# Patient Record
Sex: Female | Born: 1960 | Race: White | Hispanic: No | Marital: Married | Smoking: Former smoker
Health system: Southern US, Community
[De-identification: ages and names within clinical notes are randomized; demographics above are authoritative.]

## PROBLEM LIST (undated history)

## (undated) DIAGNOSIS — M5030 Other cervical disc degeneration, unspecified cervical region: Secondary | ICD-10-CM

## (undated) DIAGNOSIS — G8929 Other chronic pain: Secondary | ICD-10-CM

## (undated) DIAGNOSIS — M25569 Pain in unspecified knee: Secondary | ICD-10-CM

## (undated) DIAGNOSIS — T7840XA Allergy, unspecified, initial encounter: Secondary | ICD-10-CM

## (undated) DIAGNOSIS — R7303 Prediabetes: Secondary | ICD-10-CM

## (undated) DIAGNOSIS — Z720 Tobacco use: Secondary | ICD-10-CM

## (undated) DIAGNOSIS — F419 Anxiety disorder, unspecified: Secondary | ICD-10-CM

## (undated) DIAGNOSIS — F32A Depression, unspecified: Secondary | ICD-10-CM

## (undated) DIAGNOSIS — R269 Unspecified abnormalities of gait and mobility: Secondary | ICD-10-CM

## (undated) DIAGNOSIS — M25519 Pain in unspecified shoulder: Secondary | ICD-10-CM

## (undated) DIAGNOSIS — M6740 Ganglion, unspecified site: Secondary | ICD-10-CM

## (undated) DIAGNOSIS — E079 Disorder of thyroid, unspecified: Secondary | ICD-10-CM

## (undated) DIAGNOSIS — I10 Essential (primary) hypertension: Secondary | ICD-10-CM

## (undated) DIAGNOSIS — K219 Gastro-esophageal reflux disease without esophagitis: Secondary | ICD-10-CM

## (undated) DIAGNOSIS — E039 Hypothyroidism, unspecified: Secondary | ICD-10-CM

## (undated) DIAGNOSIS — M1990 Unspecified osteoarthritis, unspecified site: Secondary | ICD-10-CM

## (undated) DIAGNOSIS — R000 Tachycardia, unspecified: Secondary | ICD-10-CM

## (undated) DIAGNOSIS — F329 Major depressive disorder, single episode, unspecified: Secondary | ICD-10-CM

## (undated) HISTORY — DX: Pain in unspecified shoulder: M25.519

## (undated) HISTORY — DX: Pain in unspecified knee: M25.569

## (undated) HISTORY — DX: Disorder of thyroid, unspecified: E07.9

## (undated) HISTORY — DX: Allergy, unspecified, initial encounter: T78.40XA

## (undated) HISTORY — PX: LUNG SURGERY: SHX703

## (undated) HISTORY — PX: FOOT SURGERY: SHX648

## (undated) HISTORY — DX: Anxiety disorder, unspecified: F41.9

## (undated) HISTORY — DX: Ganglion, unspecified site: M67.40

## (undated) HISTORY — DX: Tobacco use: Z72.0

## (undated) HISTORY — PX: FACIAL FRACTURE SURGERY: SHX1570

## (undated) HISTORY — DX: Other chronic pain: G89.29

## (undated) HISTORY — DX: Major depressive disorder, single episode, unspecified: F32.9

## (undated) HISTORY — DX: Depression, unspecified: F32.A

---

## 1995-05-08 HISTORY — PX: AUGMENTATION MAMMAPLASTY: SUR837

## 1997-07-09 ENCOUNTER — Inpatient Hospital Stay (HOSPITAL_COMMUNITY): Admission: AD | Admit: 1997-07-09 | Discharge: 1997-07-14 | Payer: Self-pay | Admitting: *Deleted

## 1997-08-17 ENCOUNTER — Other Ambulatory Visit: Admission: RE | Admit: 1997-08-17 | Discharge: 1997-08-17 | Payer: Self-pay | Admitting: *Deleted

## 1998-09-13 ENCOUNTER — Other Ambulatory Visit: Admission: RE | Admit: 1998-09-13 | Discharge: 1998-09-13 | Payer: Self-pay | Admitting: *Deleted

## 1998-12-30 ENCOUNTER — Ambulatory Visit (HOSPITAL_COMMUNITY): Admission: RE | Admit: 1998-12-30 | Discharge: 1998-12-30 | Payer: Self-pay | Admitting: Obstetrics and Gynecology

## 1998-12-30 ENCOUNTER — Encounter: Payer: Self-pay | Admitting: Obstetrics and Gynecology

## 1999-03-22 ENCOUNTER — Inpatient Hospital Stay (HOSPITAL_COMMUNITY): Admission: AD | Admit: 1999-03-22 | Discharge: 1999-03-25 | Payer: Self-pay | Admitting: *Deleted

## 1999-05-11 ENCOUNTER — Other Ambulatory Visit: Admission: RE | Admit: 1999-05-11 | Discharge: 1999-05-11 | Payer: Self-pay | Admitting: *Deleted

## 2000-09-02 ENCOUNTER — Other Ambulatory Visit: Admission: RE | Admit: 2000-09-02 | Discharge: 2000-09-02 | Payer: Self-pay | Admitting: *Deleted

## 2001-09-04 ENCOUNTER — Other Ambulatory Visit: Admission: RE | Admit: 2001-09-04 | Discharge: 2001-09-04 | Payer: Self-pay | Admitting: *Deleted

## 2002-05-07 HISTORY — PX: COLONOSCOPY: SHX174

## 2002-09-18 ENCOUNTER — Other Ambulatory Visit: Admission: RE | Admit: 2002-09-18 | Discharge: 2002-09-18 | Payer: Self-pay | Admitting: *Deleted

## 2003-10-28 ENCOUNTER — Other Ambulatory Visit: Admission: RE | Admit: 2003-10-28 | Discharge: 2003-10-28 | Payer: Self-pay | Admitting: Obstetrics and Gynecology

## 2006-10-29 ENCOUNTER — Encounter (INDEPENDENT_AMBULATORY_CARE_PROVIDER_SITE_OTHER): Payer: Self-pay | Admitting: Internal Medicine

## 2006-12-03 ENCOUNTER — Ambulatory Visit: Payer: Self-pay | Admitting: Family Medicine

## 2006-12-03 ENCOUNTER — Encounter (INDEPENDENT_AMBULATORY_CARE_PROVIDER_SITE_OTHER): Payer: Self-pay | Admitting: Internal Medicine

## 2006-12-03 DIAGNOSIS — F411 Generalized anxiety disorder: Secondary | ICD-10-CM | POA: Insufficient documentation

## 2006-12-03 DIAGNOSIS — F329 Major depressive disorder, single episode, unspecified: Secondary | ICD-10-CM | POA: Insufficient documentation

## 2006-12-03 DIAGNOSIS — L659 Nonscarring hair loss, unspecified: Secondary | ICD-10-CM | POA: Insufficient documentation

## 2006-12-03 DIAGNOSIS — K219 Gastro-esophageal reflux disease without esophagitis: Secondary | ICD-10-CM | POA: Insufficient documentation

## 2006-12-04 ENCOUNTER — Telehealth (INDEPENDENT_AMBULATORY_CARE_PROVIDER_SITE_OTHER): Payer: Self-pay | Admitting: Internal Medicine

## 2006-12-05 LAB — CONVERTED CEMR LAB
BUN: 19 mg/dL (ref 6–23)
Basophils Absolute: 0 10*3/uL (ref 0.0–0.1)
Basophils Relative: 0 % (ref 0–1)
CO2: 25 meq/L (ref 19–32)
Calcium: 9.7 mg/dL (ref 8.4–10.5)
Chloride: 101 meq/L (ref 96–112)
Creatinine, Ser: 0.87 mg/dL (ref 0.40–1.20)
Eosinophils Absolute: 0.2 10*3/uL (ref 0.0–0.7)
Eosinophils Relative: 1 % (ref 0–5)
Glucose, Bld: 69 mg/dL — ABNORMAL LOW (ref 70–99)
HCT: 43.4 % (ref 36.0–46.0)
Hemoglobin: 14.5 g/dL (ref 12.0–15.0)
Lymphocytes Relative: 47 % — ABNORMAL HIGH (ref 12–46)
Lymphs Abs: 6 10*3/uL — ABNORMAL HIGH (ref 0.7–3.3)
MCHC: 33.4 g/dL (ref 30.0–36.0)
MCV: 98.6 fL (ref 78.0–100.0)
Monocytes Absolute: 0.8 10*3/uL — ABNORMAL HIGH (ref 0.2–0.7)
Monocytes Relative: 6 % (ref 3–11)
Neutro Abs: 5.9 10*3/uL (ref 1.7–7.7)
Neutrophils Relative %: 46 % (ref 43–77)
Platelets: 360 10*3/uL (ref 150–400)
Potassium: 4.7 meq/L (ref 3.5–5.3)
RBC: 4.4 M/uL (ref 3.87–5.11)
RDW: 13.4 % (ref 11.5–14.0)
Sodium: 141 meq/L (ref 135–145)
TSH: 2.767 microintl units/mL (ref 0.350–5.50)
WBC: 12.8 10*3/uL — ABNORMAL HIGH (ref 4.0–10.5)

## 2007-12-04 ENCOUNTER — Ambulatory Visit: Payer: Self-pay | Admitting: Obstetrics and Gynecology

## 2010-02-15 ENCOUNTER — Ambulatory Visit: Payer: Self-pay

## 2010-09-22 NOTE — Op Note (Signed)
[facility]  Patient:    Eileen Novak                  MRN: [id number] Proc. Date: 03/22/99 Adm. Date:  [id number] Attending:  Donne Hazel                           Operative Report  PREOPERATIVE DIAGNOSIS:       Intrauterine pregnancy at term.  Repeat cesarean section.  POSTOPERATIVE DIAGNOSIS:      Intrauterine pregnancy at term.  Repeat cesarean section.  OPERATION:                    Repeat low transverse cesarean section.  SURGEON:                      Willey Blade, M.D.  ASSISTANT:  ANESTHESIA:                   Spinal.  ESTIMATED BLOOD LOSS:         800 cc.  COMPLICATIONS:                None.  FINDINGS:                     At 1240 through a low transverse uterine incision, a viable female infant was delivered promptly from the vertex presentation without difficulty.  Apgars were 9 and 9.  Weight is pending.  The pelvis was visualized at time of surgery and noted to be normal.  DESCRIPTION OF PROCEDURE:     The patient was taken to the operating room where a spinal anesthetic was administered.  The patient was placed on the operating table in the left lateral tilt position.  The abdomen was prepped and draped in the usual sterile fashion with Betadine and sterile drapes.  A Foley catheter was sterilely inserted.  The abdomen was then entered through a Pfannenstiel incision and carried down sharply in the usual fashion.  The peritoneum was atraumatically.  The vesicouterine peritoneum overlying the lower uterine segment was incised and a bladder flap was bluntly and sharply created over the lower uterine segment.  A  bladder blade was then placed behind the bladder.  The uterus was then entered through a low transverse incision and carried out laterally using the operators  fingers.  The membranes were entered with clear fluid noted.  The vertex was easily elevated into the incision and delivered promptly at  1240.  The oral and nasopharynx were thoroughly bulb suctioned.  A nuchal cord x 1 was reduced and he baby delivered promptly.  The cord was doubly clamped and cut and the baby handed promptly to the pediatricians, Apgars were 9 and 9.  Delivery went well.  The placenta was then manually extracted intact with three vessel cord without difficulty.  The interior of the uterus was wiped clean thoroughly with a wet sponge.  The uterine incision was then closed in a two layer fashion, the first  layer a running interlocking suture of #1 Vicryl.  A second imbricating suture as placed across the primary suture line with a running stitch of #1 Vicryl as well. Good hemostasis was noted.  The pelvis was thoroughly irrigated and noted to be  hemostatic.  The pelvis was then visualized and noted to be normal.  Attention was then turned to closure.  Good hemostasis was once  again noted. The rectus muscle and anterior peritoneum was closed in the midline with a running stitch of #1 Vicryl.  The muscles were plicated in the midline with two interrupted sutures of #1 Vicryl.  The subfascial areas were hemostatic.  The fascia was then closed with #1 Vicryl, this was done by placing two sutures at the periphery of the incision and with two running stitches meeting in the midline.  The subcutaneous tissue was irrigated and made hemostatic using Bovie cautery.  The skin reapproximated with staples and a sterile dressing applied.  Final sponge, needle, and instrument counts were correct x 3.  There were no perioperative complications. The patient did well.   She did receive Cefotan 1 gram prior to delivery. DD:  03/22/99 TD:  03/22/99 Job: [id number]

## 2011-04-09 ENCOUNTER — Ambulatory Visit: Payer: Self-pay | Admitting: General Practice

## 2011-05-03 ENCOUNTER — Ambulatory Visit: Payer: Self-pay

## 2014-02-10 ENCOUNTER — Ambulatory Visit: Payer: Self-pay | Admitting: Family Medicine

## 2014-11-25 ENCOUNTER — Other Ambulatory Visit: Payer: Self-pay

## 2014-11-25 DIAGNOSIS — K219 Gastro-esophageal reflux disease without esophagitis: Secondary | ICD-10-CM

## 2014-11-25 NOTE — Telephone Encounter (Signed)
Refill request was sent to Dr. Ashany Sundaram for approval and submission.  

## 2014-11-29 ENCOUNTER — Other Ambulatory Visit: Payer: Self-pay | Admitting: Family Medicine

## 2014-11-29 NOTE — Telephone Encounter (Signed)
PT NEEDS ZANTAC (NEES THE GENERIC ) . PHARM IS [facility]

## 2014-11-29 NOTE — Telephone Encounter (Signed)
Refill request was sent to Dr. Edwena Felty for approval and submission.  Ranitidine HCl , 1 (one) Capsule daily, #30, 30 days starting 07/29/2014, Ref. x2. Active. Recorded 07/29/2014 02:14 PM by Edwena Felty, MD, Annotation/Addendum. ePrescribed to [phone], 07/29/2014 2:14 PM [facility], [address] [place], [place], [place] [address] [phone]

## 2014-12-03 ENCOUNTER — Other Ambulatory Visit: Payer: Self-pay

## 2014-12-03 DIAGNOSIS — K219 Gastro-esophageal reflux disease without esophagitis: Secondary | ICD-10-CM

## 2014-12-03 NOTE — Telephone Encounter (Signed)
Refill request was sent to Dr. Ashany Sundaram for approval and submission.  Ranitidine HCl 300MG, 1 (one) Capsule daily, #30, 30 days starting 07/29/2014, Ref. x2. Active. Recorded 07/29/2014 02:14 PM by ASHANY SUNDARAM, MD, Annotation/Addendum. ePrescribed to [phone], 07/29/2014 2:14 PM [facility], [address], [place], [place] [address] [phone] 

## 2014-12-13 ENCOUNTER — Other Ambulatory Visit: Payer: Self-pay | Admitting: Family Medicine

## 2014-12-13 NOTE — Telephone Encounter (Signed)
Pt needs a refill on alprazolam and is requesting a refill. [facility]. Pt has not been seen since 12/25/13 and I explained to pt that she needs to schedule an appt but she was wanting me to ask first.

## 2014-12-13 NOTE — Telephone Encounter (Signed)
Refill request was sent to Dr. Edwena Felty for approval and submission.  ALPRAZolam 0.5MG , 1 (one) Tablet Tablet two times daily, as needed, #60, 30 days starting 11/19/2013, Ref. x2. Active. Associated Diagnosis:Anxiety and depression (300.4  F41.8)  Recorded 12/25/2013 02:03 PM by Phineas Semen, Office Visit.

## 2015-08-29 ENCOUNTER — Encounter: Payer: Self-pay | Admitting: Family Medicine

## 2015-08-29 ENCOUNTER — Ambulatory Visit: Payer: Self-pay | Admitting: Family Medicine

## 2015-08-29 ENCOUNTER — Ambulatory Visit (INDEPENDENT_AMBULATORY_CARE_PROVIDER_SITE_OTHER): Payer: Medicaid Other | Admitting: Family Medicine

## 2015-08-29 VITALS — BP 124/68 | HR 85 | Temp 98.5°F | Resp 16 | Ht 65.0 in | Wt 136.0 lb

## 2015-08-29 DIAGNOSIS — F419 Anxiety disorder, unspecified: Secondary | ICD-10-CM

## 2015-08-29 DIAGNOSIS — M79671 Pain in right foot: Secondary | ICD-10-CM | POA: Insufficient documentation

## 2015-08-29 DIAGNOSIS — M25512 Pain in left shoulder: Secondary | ICD-10-CM

## 2015-08-29 DIAGNOSIS — K5900 Constipation, unspecified: Secondary | ICD-10-CM | POA: Diagnosis not present

## 2015-08-29 DIAGNOSIS — G8929 Other chronic pain: Secondary | ICD-10-CM | POA: Diagnosis not present

## 2015-08-29 DIAGNOSIS — M542 Cervicalgia: Secondary | ICD-10-CM | POA: Diagnosis not present

## 2015-08-29 DIAGNOSIS — M546 Pain in thoracic spine: Secondary | ICD-10-CM | POA: Insufficient documentation

## 2015-08-29 MED ORDER — POLYETHYLENE GLYCOL 3350 17 GM/SCOOP PO POWD: 8.5000 g | Freq: Every day | ORAL | Status: DC | PRN

## 2015-08-29 NOTE — Patient Instructions (Signed)
Try turmeric as a natural anti-inflammatory (for pain and arthritis). It comes in capsules where you buy aspirin and fish oil, but also as a spice where you buy pepper and garlic powder. Try aleve 220 mg one by mouth twice a day with food If needed, tylenol can be taken per package directions We'll refer you to the foot doctor and the physical therapist and the psychiatrist
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
## 2015-09-06 ENCOUNTER — Emergency Department: Payer: Medicaid Other

## 2015-09-06 ENCOUNTER — Encounter: Payer: Self-pay | Admitting: Emergency Medicine

## 2015-09-06 ENCOUNTER — Emergency Department
Admission: EM | Admit: 2015-09-06 | Discharge: 2015-09-06 | Disposition: A | Discharge status: Home / Self Care | Payer: Medicaid Other | Attending: Emergency Medicine | Admitting: Emergency Medicine

## 2015-09-06 DIAGNOSIS — S40022A Contusion of left upper arm, initial encounter: Secondary | ICD-10-CM | POA: Diagnosis not present

## 2015-09-06 DIAGNOSIS — F17200 Nicotine dependence, unspecified, uncomplicated: Secondary | ICD-10-CM | POA: Insufficient documentation

## 2015-09-06 DIAGNOSIS — F329 Major depressive disorder, single episode, unspecified: Secondary | ICD-10-CM | POA: Insufficient documentation

## 2015-09-06 DIAGNOSIS — Y9389 Activity, other specified: Secondary | ICD-10-CM | POA: Diagnosis not present

## 2015-09-06 DIAGNOSIS — Y9289 Other specified places as the place of occurrence of the external cause: Secondary | ICD-10-CM | POA: Insufficient documentation

## 2015-09-06 DIAGNOSIS — K5900 Constipation, unspecified: Secondary | ICD-10-CM | POA: Insufficient documentation

## 2015-09-06 DIAGNOSIS — Y999 Unspecified external cause status: Secondary | ICD-10-CM | POA: Diagnosis not present

## 2015-09-06 DIAGNOSIS — M79602 Pain in left arm: Secondary | ICD-10-CM | POA: Diagnosis present

## 2015-09-06 DIAGNOSIS — W1800XA Striking against unspecified object with subsequent fall, initial encounter: Secondary | ICD-10-CM | POA: Insufficient documentation

## 2015-09-06 DIAGNOSIS — S63502A Unspecified sprain of left wrist, initial encounter: Secondary | ICD-10-CM | POA: Insufficient documentation

## 2015-09-06 MED ORDER — NAPROXEN 500 MG PO TABS: 500.0000 mg | ORAL_TABLET | Freq: Two times a day (BID) | ORAL | Status: DC

## 2015-09-06 NOTE — Assessment & Plan Note (Signed)
I encouraged patient to work with counselor; I believe she will benefit from EBT; I also recommended that she see a psychaitrist if she feels her anxiety is bad enough that it warrants a benzo; I do not plan on refilling the benzo; she is not interested in SSRI or other daily medical therapy;

## 2015-09-06 NOTE — ED Provider Notes (Signed)
[facility] Emergency Department Provider Note  ____________________________________________  Time seen: Approximately 12:12 PM  I have reviewed the triage vital signs and the nursing notes.   HISTORY  Chief Complaint Arm Pain    HPI Eileen Novak is a 55 y.o. female, NAD, presents to the emergency department today after a fall onto her left arm. She states that she was pulling a stump up from her yard when part of it broke off and she fell backwards on to cement, landing on her left wrist and forearm. She complains of pain to the left wrist and distal forearm with movement. She believes she may have hit her head during the fall but denies any LOC, dizziness, headaches, visual changes, numbness, weakness, tingling. Denies any neck or back pain. She has not taken anything nor treated her arm pain at this time. Denies any open wounds or lacerations. Has not noted any swelling or bruising.   Past Medical History  Diagnosis Date  . Anxiety   . Shoulder pain   . Depression   . Allergy     Patient Active Problem List   Diagnosis Date Noted  . Constipation 09/06/2015  . Anxiety disorder 08/29/2015  . Pain of right foot 08/29/2015  . Chronic left shoulder pain 08/29/2015  . Neck pain, chronic 08/29/2015  . Bilateral thoracic back pain 08/29/2015  . DEPRESSION 12/03/2006  . GERD 12/03/2006    Past Surgical History  Procedure Laterality Date  . Foot surgery    . Facial fracture surgery      Current Outpatient Rx  Name  Route  Sig  Dispense  Refill  . naproxen (NAPROSYN) 500 MG tablet   Oral   Take 1 tablet (500 mg total) by mouth 2 (two) times daily with a meal.   14 tablet   0   . polyethylene glycol powder (GLYCOLAX/MIRALAX) powder   Oral   Take 8.5-17 g by mouth daily as needed.   3350 g   1     Allergies Escitalopram oxalate and Penicillins  Family History  Problem Relation Age of Onset  . Cancer Mother     breast  . Depression  Mother   . Heart disease Father   . Depression Brother   . ADD / ADHD Brother     Social History Social History  Substance Use Topics  . Smoking status: Current Every Day Smoker  . Smokeless tobacco: Never Used  . Alcohol Use: 0.0 oz/week    0 Standard drinks or equivalent per week     Review of Systems  Constitutional: No fever/chills Cardiovascular: No chest pain. Respiratory: No cough. No shortness of breath. No wheezing.  Musculoskeletal: Positive for left arm pain. Negative for back pain.  Skin: Negative for rash.  Neurological: Negative for headaches, focal weakness or numbness. 10-point ROS otherwise negative.  ____________________________________________   PHYSICAL EXAM:  VITAL SIGNS: ED Triage Vitals  Enc Vitals Group     BP 09/06/15 1137 110/67 mmHg     Pulse Rate 09/06/15 1137 84     Resp 09/06/15 1137 20     Temp 09/06/15 1137 98.1 F (36.7 C)     Temp Source 09/06/15 1137 Oral     SpO2 09/06/15 1137 97 %     Weight 09/06/15 1137 134 lb (60.782 kg)     Height 09/06/15 1137 5\' 7"  (1.702 m)     Head Cir --      Peak Flow --  Pain Score 09/06/15 1150 7     Pain Loc --      Pain Edu? --      Excl. in GC? --     Constitutional: Alert and oriented. Well appearing and in no acute distress. Eyes: Conjunctivae are normal.  Head: Atraumatic. Cardiovascular: Good peripheral circulationWith 2+ pulses noted in left upper extremity. Capillary refill less than 3 seconds in all digits of the left upper extremity. Respiratory: Normal respiratory effort without tachypnea or retractions. Musculoskeletal: Full ROM of the left upper extremity to include shoulder, elbow, forearm, wrist, hand, fingers. Patient is able to pronate and supinate the left forearm without pain. Trace edema noted to the left wrist. Mild tenderness to palpation diffusely about the left wrist. No joint effusions. Neurologic:  Normal speech and language. No gross focal neurologic deficits are  appreciated.  Skin:  Skin is warm, dry and intact. No rash, bruising or abrasions noted. Psychiatric: Mood and affect are normal. Speech and behavior are normal. Patient exhibits appropriate insight and judgement.    ____________________________________________   LABS  None       ____________________________________________  EKG  None  ____________________________________________ RADIOLOGY I have personally viewed and evaluated these images (plain radiographs) as part of my medical decision making, as well as reviewing the written report by the radiologist.  Dg Hand Complete Left  09/06/2015  CLINICAL DATA:  Pt reports falling outside as she was pulling a limb out of ground and fell backward, landing on left arm. Pt states, "I think I hit my head," denies LOC. Pt with complaints of pain in left arm from wrist to elbow. Pt with full ROM to left arm. EXAM: LEFT HAND - COMPLETE 3+ VIEW COMPARISON:  None. FINDINGS: There is no evidence of fracture or dislocation. There is no evidence of arthropathy or other focal bone abnormality. Soft tissues are unremarkable. IMPRESSION: Negative. Electronically Signed   By: Amie Portland M.D.   On: 09/06/2015 12:43     INITIAL IMPRESSION / ASSESSMENT AND PLAN / ED COURSE  Pertinent imaging results that were available during my care of the patient were reviewed by me and considered in my medical decision making (see chart for details).  Patient's diagnosis is consistent with left wrist sprain and left arm contusion. Patient was placed in a prefabricated left wrist splint for comfort care. Patient will be discharged home with prescriptions for naproxen to take as directed. Patient is advised to apply ice to the affected areas 20 minutes 3-4 times daily as needed. Discussed the patient should complete light range of motion exercises as modeled in clinic today at least 3-4 times daily to decrease pain and swelling. Patient is to follow up with her  primary care provider or [facility] if symptoms persist past this treatment course. Patient is given ED precautions to return to the ED for any worsening or new symptoms.    ____________________________________________  FINAL CLINICAL IMPRESSION(S) / ED DIAGNOSES  Final diagnoses:  Left wrist sprain, initial encounter  Arm contusion, left, initial encounter      NEW MEDICATIONS STARTED DURING THIS VISIT:  New Prescriptions   NAPROXEN (NAPROSYN) 500 MG TABLET    Take 1 tablet (500 mg total) by mouth 2 (two) times daily with a meal.         Hope Pigeon, PA-C 09/06/15 1303  Rockne Menghini, MD 09/06/15 1531

## 2015-09-06 NOTE — Assessment & Plan Note (Signed)
Start miralax; refer to GI for colonoscopy

## 2015-09-06 NOTE — Assessment & Plan Note (Addendum)
Demonstrated stretching exercise; refer to physical therapy; may use turmeric, tylenol per package directions, NSAID; see AVS

## 2015-09-06 NOTE — ED Notes (Signed)
Pt to ed with c/o left arm pain,  Pt states she was pulling on something today and fell backwards landing on left arm.

## 2015-09-06 NOTE — Discharge Instructions (Signed)
Elastic Bandage and RICE WHAT DOES AN ELASTIC BANDAGE DO? Elastic bandages come in different shapes and sizes. They generally provide support to your injury and reduce swelling while you are healing, but they can perform different functions. Your health care provider will help you to decide what is best for your protection, recovery, or rehabilitation following an injury. WHAT ARE SOME GENERAL TIPS FOR USING AN ELASTIC BANDAGE?  Use the bandage as directed by the maker of the bandage that you are using.  Do not wrap the bandage too tightly. This may cut off the circulation in the arm or leg in the area below the bandage.  If part of your body beyond the bandage becomes blue, numb, cold, swollen, or is more painful, your bandage is most likely too tight. If this occurs, remove your bandage and reapply it more loosely.  See your health care provider if the bandage seems to be making your problems worse rather than better.  An elastic bandage should be removed and reapplied every 3-4 hours or as directed by your health care provider. WHAT IS RICE? The routine care of many injuries includes rest, ice, compression, and elevation (RICE therapy).  Rest Rest is required to allow your body to heal. Generally, you can resume your routine activities when you are comfortable and have been given permission by your health care provider. Ice Icing your injury helps to keep the swelling down and it reduces pain. Do not apply ice directly to your skin.  Put ice in a plastic bag.  Place a towel between your skin and the bag.  Leave the ice on for 20 minutes, 2-3 times per day. Do this for as long as you are directed by your health care provider. Compression Compression helps to keep swelling down, gives support, and helps with discomfort. Compression may be done with an elastic bandage. Elevation Elevation helps to reduce swelling and it decreases pain. If possible, your injured area should be placed at  or above the level of your heart or the center of your chest. WHEN SHOULD I SEEK MEDICAL CARE? You should seek medical care if:  You have persistent pain and swelling.  Your symptoms are getting worse rather than improving. These symptoms may indicate that further evaluation or further X-rays are needed. Sometimes, X-rays may not show a small broken bone (fracture) until a number of days later. Make a follow-up appointment with your health care provider. Ask when your X-ray results will be ready. Make sure that you get your X-ray results. WHEN SHOULD I SEEK IMMEDIATE MEDICAL CARE? You should seek immediate medical care if:  You have a sudden onset of severe pain at or below the area of your injury.  You develop redness or increased swelling around your injury.  You have tingling or numbness at or below the area of your injury that does not improve after you remove the elastic bandage.   This information is not intended to replace advice given to you by your health care provider. Make sure you discuss any questions you have with your health care provider.   Document Released: 10/13/2001 Document Revised: 01/12/2015 Document Reviewed: 12/07/2013 Elsevier Interactive Patient Education 2016 [employer]  Cryotherapy Cryotherapy is when you put ice on your injury. Ice helps lessen pain and puffiness (swelling) after an injury. Ice works the best when you start using it in the first 24 to 48 hours after an injury. HOME CARE  Put a dry or damp towel between the ice  pack and your skin.  You may press gently on the ice pack.  Leave the ice on for no more than 10 to 20 minutes at a time.  Check your skin after 5 minutes to make sure your skin is okay.  Rest at least 20 minutes between ice pack uses.  Stop using ice when your skin loses feeling (numbness).  Do not use ice on someone who cannot tell you when it hurts. This includes small children and people with memory problems  (dementia). GET HELP RIGHT AWAY IF:  You have white spots on your skin.  Your skin turns blue or pale.  Your skin feels waxy or hard.  Your puffiness gets worse. MAKE SURE YOU:   Understand these instructions.  Will watch your condition.  Will get help right away if you are not doing well or get worse.   This information is not intended to replace advice given to you by your health care provider. Make sure you discuss any questions you have with your health care provider.   Document Released: 10/10/2007 Document Revised: 07/16/2011 Document Reviewed: 12/14/2010 Elsevier Interactive Patient Education 2016 [employer]  Wrist Sprain With Rehab A sprain is an injury in which a ligament that maintains the proper alignment of a joint is partially or completely torn. The ligaments of the wrist are susceptible to sprains. Sprains are classified into three categories. Grade 1 sprains cause pain, but the tendon is not lengthened. Grade 2 sprains include a lengthened ligament because the ligament is stretched or partially ruptured. With grade 2 sprains there is still function, although the function may be diminished. Grade 3 sprains are characterized by a complete tear of the tendon or muscle, and function is usually impaired. SYMPTOMS   Pain tenderness, inflammation, and/or bruising (contusion) of the injury.  A "pop" or tear felt and/or heard at the time of injury.  Decreased wrist function. CAUSES  A wrist sprain occurs when a force is placed on one or more ligaments that is greater than it/they can withstand. Common mechanisms of injury include:  Catching a ball with your hands.  Repetitive and/ or strenuous extension or flexion of the wrist. RISK INCREASES WITH:  Previous wrist injury.  Contact sports (boxing or wrestling).  Activities in which falling is common.  Poor strength and flexibility.  Improperly fitted or padded protective equipment. PREVENTION  Warm up and  stretch properly before activity.  Allow for adequate recovery between workouts.  Maintain physical fitness:  Strength, flexibility, and endurance.  Cardiovascular fitness.  Protect the wrist joint by limiting its motion with the use of taping, braces, or splints.  Protect the wrist after injury for 6 to 12 months. PROGNOSIS  The prognosis for wrist sprains depends on the degree of injury. Grade 1 sprains require 2 to 6 weeks of treatment. Grade 2 sprains require 6 to 8 weeks of treatment, and grade 3 sprains require up to 12 weeks.  RELATED COMPLICATIONS   Prolonged healing time, if improperly treated or re-injured.  Recurrent symptoms that result in a chronic problem.  Injury to nearby structures (bone, cartilage, nerves, or tendons).  Arthritis of the wrist.  Inability to compete in athletics at a high level.  Wrist stiffness or weakness.  Progression to a complete rupture of the ligament. TREATMENT  Treatment initially involves resting from any activities that aggravate the symptoms, and the use of ice and medications to help reduce pain and inflammation. Your caregiver may recommend immobilizing the wrist for a  period of time in order to reduce stress on the ligament and allow for healing. After immobilization it is important to perform strengthening and stretching exercises to help regain strength and a full range of motion. These exercises may be completed at home or with a therapist. Surgery is not usually required for wrist sprains, unless the ligament has been ruptured (grade 3 sprain). MEDICATION   If pain medication is necessary, then nonsteroidal anti-inflammatory medications, such as aspirin and ibuprofen, or other minor pain relievers, such as acetaminophen, are often recommended.  Do not take pain medication for 7 days before surgery.  Prescription pain relievers may be given if deemed necessary by your caregiver. Use only as directed and only as much as you  need. HEAT AND COLD  Cold treatment (icing) relieves pain and reduces inflammation. Cold treatment should be applied for 10 to 15 minutes every 2 to 3 hours for inflammation and pain and immediately after any activity that aggravates your symptoms. Use ice packs or massage the area with a piece of ice (ice massage).  Heat treatment may be used prior to performing the stretching and strengthening activities prescribed by your caregiver, physical therapist, or athletic trainer. Use a heat pack or soak your injury in warm water. SEEK MEDICAL CARE IF:  Treatment seems to offer no benefit, or the condition worsens.  Any medications produce adverse side effects. EXERCISES RANGE OF MOTION (ROM) AND STRETCHING EXERCISES - Wrist Sprain  These exercises may help you when beginning to rehabilitate your injury. Your symptoms may resolve with or without further involvement from your physician, physical therapist or athletic trainer. While completing these exercises, remember:   Restoring tissue flexibility helps normal motion to return to the joints. This allows healthier, less painful movement and activity.  An effective stretch should be held for at least 30 seconds.  A stretch should never be painful. You should only feel a gentle lengthening or release in the stretched tissue. RANGE OF MOTION - Wrist Flexion, Active-Assisted  Extend your right / left elbow with your fingers pointing down.*  Gently pull the back of your hand towards you until you feel a gentle stretch on the top of your forearm.  Hold this position for __________ seconds. Repeat __________ times. Complete this exercise __________ times per day.  *If directed by your physician, physical therapist or athletic trainer, complete this stretch with your elbow bent rather than extended. RANGE OF MOTION - Wrist Extension, Active-Assisted  Extend your right / left elbow and turn your palm upwards.*  Gently pull your palm/fingertips  back so your wrist extends and your fingers point more toward the ground.  You should feel a gentle stretch on the inside of your forearm.  Hold this position for __________ seconds. Repeat __________ times. Complete this exercise __________ times per day. *If directed by your physician, physical therapist or athletic trainer, complete this stretch with your elbow bent, rather than extended. RANGE OF MOTION - Supination, Active  Stand or sit with your elbows at your side. Bend your right / left elbow to 90 degrees.  Turn your palm upward until you feel a gentle stretch on the inside of your forearm.  Hold this position for __________ seconds. Slowly release and return to the starting position. Repeat __________ times. Complete this stretch __________ times per day.  RANGE OF MOTION - Pronation, Active  Stand or sit with your elbows at your side. Bend your right / left elbow to 90 degrees.  Turn your palm  downward until you feel a gentle stretch on the top of your forearm.  Hold this position for __________ seconds. Slowly release and return to the starting position. Repeat __________ times. Complete this stretch __________ times per day.  STRETCH - Wrist Flexion  Place the back of your right / left hand on a tabletop leaving your elbow slightly bent. Your fingers should point away from your body.  Gently press the back of your hand down onto the table by straightening your elbow. You should feel a stretch on the top of your forearm.  Hold this position for __________ seconds. Repeat __________ times. Complete this stretch __________ times per day.  STRETCH - Wrist Extension  Place your right / left fingertips on a tabletop leaving your elbow slightly bent. Your fingers should point backwards.  Gently press your fingers and palm down onto the table by straightening your elbow. You should feel a stretch on the inside of your forearm.  Hold this position for __________  seconds. Repeat __________ times. Complete this stretch __________ times per day.  STRENGTHENING EXERCISES - Wrist Sprain These exercises may help you when beginning to rehabilitate your injury. They may resolve your symptoms with or without further involvement from your physician, physical therapist or athletic trainer. While completing these exercises, remember:   Muscles can gain both the endurance and the strength needed for everyday activities through controlled exercises.  Complete these exercises as instructed by your physician, physical therapist or athletic trainer. Progress with the resistance and repetition exercises only as your caregiver advises. STRENGTH - Wrist Flexors  Sit with your right / left forearm palm-up and fully supported. Your elbow should be resting below the height of your shoulder. Allow your wrist to extend over the edge of the surface.  Loosely holding a __________ weight or a piece of rubber exercise band/tubing, slowly curl your hand up toward your forearm.  Hold this position for __________ seconds. Slowly lower the wrist back to the starting position in a controlled manner. Repeat __________ times. Complete this exercise __________ times per day.  STRENGTH - Wrist Extensors  Sit with your right / left forearm palm-down and fully supported. Your elbow should be resting below the height of your shoulder. Allow your wrist to extend over the edge of the surface.  Loosely holding a __________ weight or a piece of rubber exercise band/tubing, slowly curl your hand up toward your forearm.  Hold this position for __________ seconds. Slowly lower the wrist back to the starting position in a controlled manner. Repeat __________ times. Complete this exercise __________ times per day.  STRENGTH - Ulnar Deviators  Stand with a ____________________ weight in your right / left hand, or sit holding on to the rubber exercise band/tubing with your opposite arm  supported.  Move your wrist so that your pinkie travels toward your forearm and your thumb moves away from your forearm.  Hold this position for __________ seconds and then slowly lower the wrist back to the starting position. Repeat __________ times. Complete this exercise __________ times per day STRENGTH - Radial Deviators  Stand with a ____________________ weight in your  right / left hand, or sit holding on to the rubber exercise band/tubing with your arm supported.  Raise your hand upward in front of you or pull up on the rubber tubing.  Hold this position for __________ seconds and then slowly lower the wrist back to the starting position. Repeat __________ times. Complete this exercise __________ times per day. STRENGTH -  Forearm Supinators  Sit with your right / left forearm supported on a table, keeping your elbow below shoulder height. Rest your hand over the edge, palm down.  Gently grip a hammer or a soup ladle.  Without moving your elbow, slowly turn your palm and hand upward to a "thumbs-up" position.  Hold this position for __________ seconds. Slowly return to the starting position. Repeat __________ times. Complete this exercise __________ times per day.  STRENGTH - Forearm Pronators  Sit with your right / left forearm supported on a table, keeping your elbow below shoulder height. Rest your hand over the edge, palm up.  Gently grip a hammer or a soup ladle.  Without moving your elbow, slowly turn your palm and hand upward to a "thumbs-up" position.  Hold this position for __________ seconds. Slowly return to the starting position. Repeat __________ times. Complete this exercise __________ times per day.  STRENGTH - Grip  Grasp a tennis ball, a dense sponge, or a large, rolled sock in your hand.  Squeeze as hard as you can without increasing any pain.  Hold this position for __________ seconds. Release your grip slowly. Repeat __________ times. Complete  this exercise __________ times per day.    This information is not intended to replace advice given to you by your health care provider. Make sure you discuss any questions you have with your health care provider.   Document Released: 04/23/2005 Document Revised: 01/12/2015 Document Reviewed: 08/05/2008 Elsevier Interactive Patient Education [employer]

## 2015-09-06 NOTE — ED Notes (Signed)
Pt in via triage; reports falling outside as she was pulling a limb out of ground and fell backward, landing on left arm.  Pt states, "I think I hit my headm," denies LOC.  Pt with complaints of pain in left arm from wrist to elbow.  Pt with full ROM to left arm.
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
## 2015-09-06 NOTE — Assessment & Plan Note (Addendum)
Refer to physical therapy; may use turmeric, tylenol per package directions, NSAID; see AVS

## 2015-09-06 NOTE — Assessment & Plan Note (Addendum)
Demonstrated stretching exercise; refer to PT; I do not plan on maintaining patient long-term on flexeril, no Rx given today; may use turmeric, tylenol per package directions, NSAID; see AVS

## 2015-10-24 ENCOUNTER — Telehealth: Payer: Self-pay | Admitting: Family Medicine

## 2015-10-24 NOTE — Telephone Encounter (Signed)
PT ASKING FOR A REFERRAL TO [facility] FOR FEMALE ISSUE . [facility] SHE CAN NOT COME ON THE 10-31-15. ANY OTHER DAY SHOULD BE OK

## 2015-10-24 NOTE — Telephone Encounter (Signed)
Can you please get a symptom or diagnosis instead of "female issue"; I need that to enter the referral to GYN I'll also need a symptom or diagnosis for Dr. Orland Jarredroxler Thank you

## 2015-10-25 NOTE — Telephone Encounter (Signed)
Pt has medicaid will need notes to fax for referral.  Pt making appt

## 2015-10-27 ENCOUNTER — Ambulatory Visit (INDEPENDENT_AMBULATORY_CARE_PROVIDER_SITE_OTHER): Payer: Medicaid Other | Admitting: Family Medicine

## 2015-10-27 ENCOUNTER — Encounter: Payer: Self-pay | Admitting: Family Medicine

## 2015-10-27 VITALS — BP 102/68 | HR 93 | Temp 98.4°F | Resp 16 | Wt 134.0 lb

## 2015-10-27 DIAGNOSIS — M25532 Pain in left wrist: Secondary | ICD-10-CM | POA: Diagnosis not present

## 2015-10-27 DIAGNOSIS — R1011 Right upper quadrant pain: Secondary | ICD-10-CM | POA: Diagnosis not present

## 2015-10-27 DIAGNOSIS — N909 Noninflammatory disorder of vulva and perineum, unspecified: Secondary | ICD-10-CM | POA: Diagnosis not present

## 2015-10-27 DIAGNOSIS — F419 Anxiety disorder, unspecified: Secondary | ICD-10-CM | POA: Diagnosis not present

## 2015-10-27 DIAGNOSIS — R252 Cramp and spasm: Secondary | ICD-10-CM | POA: Diagnosis not present

## 2015-10-27 DIAGNOSIS — M79671 Pain in right foot: Secondary | ICD-10-CM | POA: Diagnosis not present

## 2015-10-27 NOTE — Progress Notes (Signed)
BP 102/68 mmHg  Pulse 93  Temp(Src) 98.4 F (36.9 C) (Oral)  Resp 16  Wt 134 lb (60.782 kg)  SpO2 97%   Subjective:    Patient ID: Eileen Novak, female    DOB: 1960-11-15, 55 y.o.   MRN: [id number]  HPI: Eileen Novak is a 55 y.o. female  Chief Complaint  Patient presents with  . Referral    GYN spot on bottom  . Referral    podiatrist for foot pain  . Wrist Pain    left due to a fall   Tiny bump on her bottom; two to three years, little bump; starting to get irritated with wiping; not bothersome before; no bleeding, does have hemorrhoids; would like to see gyn to have it removed; [facility]  Crushed her right ankle/foot/heel about 25 years ago; then broke arch of the left foot; getting up in the morning, it's all I can do to walk she says; she was referred to podiatry in April; she says she has not heard about that yet; she has a bump on the bottom of the left foot; two bumps, they were there about a month and a half; they are painful sometimes; she gets charlie horses in her legs and feet; she was asking for a muscle relaxer; could take one or two, started with C; she asked me twice for this and asked the CMA once as well  She was contacted about therapy, but says if she had time to exercise; she would rather rest; she says she is in so much stress; she felt right sided pain with pain with each breath she took; 1.5 weeks ago; lives with boyfriend, has a special needs child; sitting at [facility], got pain in the right upper quadrant, felt like something was squeezing; still has gallbadder  Left wrist pain; it's okay now; only when picking up certain things behind her; fell on it, went to emergency dept; 2 months ago; pulling up bush out of loose ground, working in the yard, giving it all her might and the limb broke, fell on her wrist Sep 06, 2015 CLINICAL DATA: Pt reports falling outside as she was pulling a limb out of ground and fell backward, landing on left arm. Pt states,  "I think I hit my head," denies LOC. Pt with complaints of pain in left arm from wrist to elbow. Pt with full ROM to left arm.  EXAM: LEFT HAND - COMPLETE 3+ VIEW  COMPARISON: None.  FINDINGS: There is no evidence of fracture or dislocation. There is no evidence of arthropathy or other focal bone abnormality. Soft tissues are unremarkable.  IMPRESSION: Negative.   Electronically Signed  By: Amie Portland M.D.  On: 09/06/2015 12:43  She has not been to see psychiatrist; she still has the voicemail on her phone; talked about Xanax and stress  Depression screen [facility] 2/9 08/29/2015  Decreased Interest 1  Down, Depressed, Hopeless 1  PHQ - 2 Score 2  Altered sleeping 3  Tired, decreased energy 0  Change in appetite 0  Feeling bad or failure about yourself  1  Trouble concentrating 0  Moving slowly or fidgety/restless 0  Suicidal thoughts 0  PHQ-9 Score 6  Difficult doing work/chores Not difficult at all   Relevant past medical, surgical, family and social history reviewed Past Medical History  Diagnosis Date  . Anxiety   . Shoulder pain   . Depression   . Allergy    Past Surgical History  Procedure Laterality Date  .  Foot surgery    . Facial fracture surgery     Family History  Problem Relation Age of Onset  . Cancer Mother     breast  . Depression Mother   . Heart disease Father   . Depression Brother   . ADD / ADHD Brother    Social History  Substance Use Topics  . Smoking status: Current Every Day Smoker  . Smokeless tobacco: Never Used  . Alcohol Use: 0.0 oz/week    0 Standard drinks or equivalent per week   Interim medical history since last visit reviewed. Allergies and medications reviewed  Review of Systems Per HPI unless specifically indicated above     Objective:    BP 102/68 mmHg  Pulse 93  Temp(Src) 98.4 F (36.9 C) (Oral)  Resp 16  Wt 134 lb (60.782 kg)  SpO2 97%  Wt Readings from Last 3 Encounters:  10/27/15 134 lb  (60.782 kg)  09/06/15 134 lb (60.782 kg)  08/29/15 136 lb (61.689 kg)    Physical Exam  Constitutional: She appears well-developed and well-nourished.  Cardiovascular: Normal rate and regular rhythm.   Pulmonary/Chest: Breath sounds normal.  Abdominal: Normal appearance. She exhibits no distension.  Genitourinary:    There is no rash on the right labia. There is no rash on the left labia. No bleeding in the vagina.  Musculoskeletal:       Left wrist: She exhibits normal range of motion, no tenderness, no bony tenderness, no swelling, no effusion and no deformity.       Right ankle: She exhibits decreased range of motion and swelling (mild, laterally). She exhibits no deformity.  Psychiatric: Her mood appears not anxious. She does not exhibit a depressed mood.  Fair historian; jumps from one problem to another; stood through some of the visit near the end    Results for orders placed or performed in visit on 12/03/06  Converted [facility] Lab  Result Value Ref Range   WBC 12.8 (H) 4.0-10.5 10*3/microliter   RBC 4.40 3.87-5.11 M/uL   Hemoglobin 14.5 12.0-15.0 g/dL   HCT 16.143.4 09.6-04.536.0-46.0 %   MCV 98.6 78.0-100.0 fL   MCHC 33.4 30.0-36.0 g/dL   RDW 40.913.4 81.1-91.411.5-14.0 %   Platelets 360 150-400 K/uL   Neutrophils Relative % 46 43-77 %   Neutro Abs 5.9 1.7-7.7 K/uL   Lymphocytes Relative 47 (H) 12-46 %   Lymphs Abs 6.0 (H) 0.7-3.3 K/uL   Monocytes Relative 6 3-11 %   Monocytes Absolute 0.8 (H) 0.2-0.7 K/uL   Eosinophils Relative 1 0-5 %   Eosinophils Absolute 0.2 0.0-0.7 K/uL   Basophils Relative 0 0-1 %   Basophils Absolute 0.0 0.0-0.1 K/uL   Sodium 141 135-145 meq/L   Potassium 4.7 3.5-5.3 meq/L   Chloride 101 96-112 meq/L   CO2 25 19-32 meq/L   Glucose, Bld 69 (L) 70-99 mg/dL   BUN 19 7-826-23 mg/dL   Creatinine, Ser 9.560.87 0.40-1.20 mg/dL   Calcium 9.7 2.1-30.88.4-10.5 mg/dL   TSH 6.5782.767 4.696-2.950.350-5.50 microintl units/mL      Assessment & Plan:   Problem List Items Addressed This Visit       Other   Anxiety disorder    I am not going to prescribe this patient Xanax; she was referred to psychiatrist back in April and was contacted; I encouraged her to return that call and start working with a therapist      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   Relevant Orders   Ambulatory referral to Gynecology  Pain of right foot    Referral to podiatrist was put in back in [facility]; will have staff check on that referral; patient was given info today; I am not going to prescribe narcotics for this chronic pain      Right upper quadrant abdominal pain - Primary    Explained may have been gallbladder attack that she had, but will get labs today and order RUQ US; if symptoms return, then go to ER      Relevant Orders   US Abdomen Limited RUQ   CBC with Differential/Platelet   Comprehensive metabolic panel   Amylase   Lipase    Other Visit Diagnoses    Left wrist pain        from fall two months ago; reviewed xray report; nothing appears unusual on exam; no pain medicine prescribed    Cramps of lower extremity, unspecified laterality        patient requested muscle relaxers for this; I declined that request and encouraged stretching, working w/PT, tonic water, magnesium, natural remedies       Follow up plan: Return if symptoms worsen or fail to improve.  An after-visit summary was printed and given to the patient at check-out.  Please see the patient instructions which may contain other information and recommendations beyond what is mentioned above in the assessment and plan.  No orders of the defined types were placed in this encounter.    Orders Placed This Encounter  Procedures  . US Abdomen Limited RUQ  . CBC with Differential/Platelet  . Comprehensive metabolic panel  . Amylase  . Lipase  . Ambulatory referral to Gynecology

## 2015-10-27 NOTE — Assessment & Plan Note (Signed)
I am not going to prescribe this patient Xanax; she was referred to psychiatrist back in April and was contacted; I encouraged her to return that call and start working with a therapist

## 2015-10-27 NOTE — Assessment & Plan Note (Signed)
Explained may have been gallbladder attack that she had, but will get labs today and order RUQ US; if symptoms return, then go to ER

## 2015-10-27 NOTE — Assessment & Plan Note (Addendum)
Referral to podiatrist was put in back in [employer]; will have staff check on that referral; patient was given info today; I am not going to prescribe narcotics for this chronic pain

## 2015-10-27 NOTE — Patient Instructions (Addendum)
We'll have you see the gynecologist Please see the podiatrist Try tonic water 4 ounces in the evenings Try magnesium oxide 250 mg every day for cramps Let's get labs today If you pain recurs and is significant, go right to the ER We'll get an ultrasound next week I really encourage therapy Try yoga or tai chi for stretching Try turmeric as a natural anti-inflammatory (for pain and arthritis). It comes in capsules where you buy aspirin and fish oil, but also as a spice where you buy pepper and garlic powder.

## 2015-10-28 LAB — CBC WITH DIFFERENTIAL/PLATELET
BASOS ABS: 0 {cells}/uL (ref 0–200)
Basophils Relative: 0 %
EOS PCT: 3 %
Eosinophils Absolute: 234 cells/uL (ref 15–500)
HEMATOCRIT: 40.2 % (ref 35.0–45.0)
Hemoglobin: 13 g/dL (ref 11.7–15.5)
LYMPHS PCT: 33 %
Lymphs Abs: 2574 cells/uL (ref 850–3900)
MCH: 31.4 pg (ref 27.0–33.0)
MCHC: 32.3 g/dL (ref 32.0–36.0)
MCV: 97.1 fL (ref 80.0–100.0)
MPV: 10 fL (ref 7.5–12.5)
Monocytes Absolute: 468 cells/uL (ref 200–950)
Monocytes Relative: 6 %
NEUTROS PCT: 58 %
Neutro Abs: 4524 cells/uL (ref 1500–7800)
Platelets: 334 10*3/uL (ref 140–400)
RBC: 4.14 MIL/uL (ref 3.80–5.10)
RDW: 12.9 % (ref 11.0–15.0)
WBC: 7.8 10*3/uL (ref 3.8–10.8)

## 2015-10-28 LAB — LIPASE: LIPASE: 49 U/L (ref 7–60)

## 2015-10-28 LAB — COMPREHENSIVE METABOLIC PANEL
ALBUMIN: 4.3 g/dL (ref 3.6–5.1)
ALK PHOS: 76 U/L (ref 33–130)
ALT: 13 U/L (ref 6–29)
AST: 22 U/L (ref 10–35)
BUN: 27 mg/dL — ABNORMAL HIGH (ref 7–25)
CALCIUM: 9.8 mg/dL (ref 8.6–10.4)
CHLORIDE: 108 mmol/L (ref 98–110)
CO2: 23 mmol/L (ref 20–31)
Creat: 1.16 mg/dL — ABNORMAL HIGH (ref 0.50–1.05)
Glucose, Bld: 86 mg/dL (ref 65–99)
POTASSIUM: 4.4 mmol/L (ref 3.5–5.3)
Sodium: 142 mmol/L (ref 135–146)
TOTAL PROTEIN: 6.6 g/dL (ref 6.1–8.1)
Total Bilirubin: 0.2 mg/dL (ref 0.2–1.2)

## 2015-10-28 LAB — AMYLASE: AMYLASE: 110 U/L — AB (ref 0–105)

## 2015-11-01 ENCOUNTER — Ambulatory Visit
Admission: RE | Admit: 2015-11-01 | Discharge: 2015-11-01 | Disposition: A | Discharge status: Home / Self Care | Payer: Medicaid Other | Source: Ambulatory Visit | Attending: Family Medicine | Admitting: Family Medicine

## 2015-11-01 ENCOUNTER — Other Ambulatory Visit: Payer: Self-pay | Admitting: Family Medicine

## 2015-11-01 ENCOUNTER — Telehealth: Payer: Self-pay | Admitting: Family Medicine

## 2015-11-01 DIAGNOSIS — K7689 Other specified diseases of liver: Secondary | ICD-10-CM | POA: Diagnosis not present

## 2015-11-01 DIAGNOSIS — R1011 Right upper quadrant pain: Secondary | ICD-10-CM | POA: Insufficient documentation

## 2015-11-01 NOTE — Assessment & Plan Note (Signed)
See US; will get HIDA scan

## 2015-11-01 NOTE — Telephone Encounter (Signed)
[facility]YN CALLED AND SIAD THAT THE PATIENT HAS AN APPT ON AUG. 2 2017 @ 10:10 WITH DR WARD AND THE PATIENT HAS BEEN NOTIFIED.

## 2015-11-22 ENCOUNTER — Encounter: Payer: Self-pay | Admitting: Podiatry

## 2015-11-22 ENCOUNTER — Telehealth: Payer: Self-pay | Admitting: Family Medicine

## 2015-11-22 ENCOUNTER — Ambulatory Visit
Admission: RE | Admit: 2015-11-22 | Discharge: 2015-11-22 | Disposition: A | Discharge status: Home / Self Care | Payer: Medicaid Other | Source: Ambulatory Visit | Attending: Family Medicine | Admitting: Family Medicine

## 2015-11-22 DIAGNOSIS — R1011 Right upper quadrant pain: Secondary | ICD-10-CM | POA: Insufficient documentation

## 2015-11-22 MED ORDER — TECHNETIUM TC 99M MEBROFENIN IV KIT
5.0000 | PACK | Freq: Once | INTRAVENOUS | Status: AC | PRN
  Administered 2015-11-22: 5.07 via INTRAVENOUS

## 2015-11-22 NOTE — Telephone Encounter (Signed)
Pt needs to know about referral to foot doctor. Dr [place]land [place]ler in [place] and also if she needs to schedule an appt.

## 2015-12-14 ENCOUNTER — Ambulatory Visit: Payer: Medicaid Other | Admitting: Podiatry

## 2015-12-19 NOTE — Progress Notes (Signed)
Error

## 2016-01-03 ENCOUNTER — Encounter: Payer: Self-pay | Admitting: *Deleted

## 2016-01-03 ENCOUNTER — Encounter: Payer: Self-pay | Admitting: Family Medicine

## 2016-01-03 ENCOUNTER — Emergency Department: Payer: Medicaid Other

## 2016-01-03 ENCOUNTER — Ambulatory Visit (INDEPENDENT_AMBULATORY_CARE_PROVIDER_SITE_OTHER): Payer: Medicaid Other | Admitting: Family Medicine

## 2016-01-03 ENCOUNTER — Emergency Department
Admission: EM | Admit: 2016-01-03 | Discharge: 2016-01-03 | Disposition: A | Discharge status: Home / Self Care | Payer: Medicaid Other | Attending: Emergency Medicine | Admitting: Emergency Medicine

## 2016-01-03 DIAGNOSIS — Z720 Tobacco use: Secondary | ICD-10-CM | POA: Diagnosis not present

## 2016-01-03 DIAGNOSIS — R05 Cough: Secondary | ICD-10-CM | POA: Diagnosis not present

## 2016-01-03 DIAGNOSIS — F17210 Nicotine dependence, cigarettes, uncomplicated: Secondary | ICD-10-CM | POA: Insufficient documentation

## 2016-01-03 DIAGNOSIS — R0789 Other chest pain: Secondary | ICD-10-CM | POA: Insufficient documentation

## 2016-01-03 DIAGNOSIS — Z791 Long term (current) use of non-steroidal anti-inflammatories (NSAID): Secondary | ICD-10-CM | POA: Diagnosis not present

## 2016-01-03 LAB — CBC
HCT: 37.8 % (ref 35.0–47.0)
Hemoglobin: 13.2 g/dL (ref 12.0–16.0)
MCH: 33.4 pg (ref 26.0–34.0)
MCHC: 34.9 g/dL (ref 32.0–36.0)
MCV: 95.6 fL (ref 80.0–100.0)
PLATELETS: 260 10*3/uL (ref 150–440)
RBC: 3.96 MIL/uL (ref 3.80–5.20)
RDW: 13.1 % (ref 11.5–14.5)
WBC: 8.7 10*3/uL (ref 3.6–11.0)

## 2016-01-03 LAB — BASIC METABOLIC PANEL
Anion gap: 9 (ref 5–15)
BUN: 36 mg/dL — ABNORMAL HIGH (ref 6–20)
CHLORIDE: 105 mmol/L (ref 101–111)
CO2: 26 mmol/L (ref 22–32)
CREATININE: 1.05 mg/dL — AB (ref 0.44–1.00)
Calcium: 10 mg/dL (ref 8.9–10.3)
GFR, EST NON AFRICAN AMERICAN: 59 mL/min — AB (ref >60)
Glucose, Bld: 95 mg/dL (ref 65–99)
Potassium: 4 mmol/L (ref 3.5–5.1)
SODIUM: 140 mmol/L (ref 135–145)

## 2016-01-03 LAB — TROPONIN I

## 2016-01-03 NOTE — Discharge Instructions (Signed)
Return to the emergency department for new or worsening pain, shortness of breath, passing out, or for any other concerns.

## 2016-01-03 NOTE — ED Provider Notes (Signed)
[facility] Emergency Department Provider Note  ____________________________________________   First MD Initiated Contact with Patient 01/03/16 1743     (approximate)  I have reviewed the triage vital signs and the nursing notes.   HISTORY  Chief Complaint Chest Pain  HPI Eileen Novak is a 55 y.o. female with history of the thorax 2 who is sent from Dr. Marlise Eves office because she has been having left-sided chest pain for one week. She states that the pain occurs every day but is not constant. It is generally left sided radiates around to the lateral aspect of her chest wall and is occasionally on the right side. It is improved with Advil. No shortness of breath, nausea, abdominal pain. He has had a dry cough during this timeframe.  She has been taking an anti-inflammatory medication for foot pain as well as BC powders so she is concerned about having an ulcer.  She did have an episode of pain in the ER but is currently pain-free.  Past Medical History:  Diagnosis Date  . Allergy   . Anxiety   . Depression   . Shoulder pain     Patient Active Problem List   Diagnosis Date Noted  . Right upper quadrant abdominal pain 10/27/2015  . 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 10/27/2015  . Constipation 09/06/2015  . Anxiety disorder 08/29/2015  . Pain of right foot 08/29/2015  . Chronic left shoulder pain 08/29/2015  . Neck pain, chronic 08/29/2015  . Bilateral thoracic back pain 08/29/2015  . DEPRESSION 12/03/2006  . GERD 12/03/2006    Past Surgical History:  Procedure Laterality Date  . FACIAL FRACTURE SURGERY    . FOOT SURGERY      Prior to Admission medications   Medication Sig Start Date End Date Taking? Authorizing Provider  cyclobenzaprine (FLEXERIL) 5 MG tablet Take by mouth. 12/06/15   Historical Provider, MD  meloxicam (MOBIC) 15 MG tablet Take by mouth. 12/06/15   Historical Provider, MD    Allergies Escitalopram oxalate and Penicillins  Family  History  Problem Relation Age of Onset  . Cancer Mother     breast  . Depression Mother   . Heart disease Father   . Depression Brother   . ADD / ADHD Brother     Social History Social History  Substance Use Topics  . Smoking status: Current Every Day Smoker    Packs/day: 1.00    Years: 40.00    Types: Cigarettes  . Smokeless tobacco: Never Used  . Alcohol use 0.0 oz/week     Comment: occasionally    Review of Systems Constitutional: No fever/chills Eyes: No visual changes. ENT: No sore throat. Cardiovascular: Denies chest pain. Respiratory: Denies shortness of breath. Gastrointestinal: No abdominal pain.  No nausea, no vomiting.   Musculoskeletal: Negative for back pain. 10-point ROS otherwise negative.  ____________________________________________   PHYSICAL EXAM:  VITAL SIGNS: ED Triage Vitals  Enc Vitals Group     BP 01/03/16 1537 130/80     Pulse Rate 01/03/16 1537 79     Resp 01/03/16 1537 20     Temp 01/03/16 1537 98.1 F (36.7 C)     Temp Source 01/03/16 1537 Oral     SpO2 01/03/16 1537 99 %     Weight 01/03/16 1538 139 lb (63 kg)     Height 01/03/16 1538 5\' 6"  (1.676 m)     Head Circumference --      Peak Flow --      Pain  Score 01/03/16 1538 6     Pain Loc --      Pain Edu? --      Excl. in GC? --    Constitutional: Alert and oriented. Well appearing and in no acute distress. Eyes: Conjunctivae are normal. PERRL. EOMI. Head: Atraumatic. Nose: No congestion/rhinnorhea. Mouth/Throat: Mucous membranes are moist.  Oropharynx non-erythematous. Neck: No stridor.  Cardiovascular: Normal rate, regular rhythm. Grossly normal heart sounds.  Good peripheral circulation. Respiratory: Normal respiratory effort.  No retractions. Lungs CTAB. Gastrointestinal: Soft and nontender. No distention. No abdominal bruits. No CVA tenderness. Musculoskeletal: No lower extremity tenderness nor edema.  No joint effusions. Neurologic:  Normal speech and language. No  gross focal neurologic deficits are appreciated. No gait instability. Skin:  Skin is warm, dry and intact. No rash noted. Psychiatric: Mood and affect are normal. Speech and behavior are normal.  ____________________________________________   LABS (all labs ordered are listed, but only abnormal results are displayed)  Labs Reviewed  BASIC METABOLIC PANEL - Abnormal; Notable for the following:       Result Value   BUN 36 (*)    Creatinine, Ser 1.05 (*)    GFR calc non Af Amer 59 (*)    All other components within normal limits  CBC  TROPONIN I   ____________________________________________  EKG  ekg- Date: 01/03/2016  Rate:78  Rhythm: normal sinus rhythm w arrhythmia  QRS Axis: normal  Intervals: normal  ST/T Wave abnormalities: normal  Conduction Disutrbances: none  Narrative Interpretation: unremarkable  ____________________________________________  RADIOLOGY cxr-IMPRESSION: 1. No acute cardiopulmonary disease. 2. Lung hyperexpansion suggests COPD.  Mild apical lung scarring.   Electronically Signed   By: Amie Portlandavid  Ormond M.D.   On: 01/03/2016 16:38 ____________________________________________  Procedures-none  ____________________   INITIAL IMPRESSION / ASSESSMENT AND PLAN / ED COURSE  Pertinent labs & imaging results that were available during my care of the patient were reviewed by me and considered in my medical decision making (see chart for details).  Patient has had pain for 1 week with a normal EKG and negative cardiac enzymes. She does not have recurrence of her pneumothorax. Her only risk factor for coronary artery disease is smoking. I will refer her for cardiac stress testing and I have given her strict return precautions.  Clinical Course  7p-Patient remains pain free and is anxious for discharge.   ____________________________________________   FINAL CLINICAL IMPRESSION(S) / ED DIAGNOSES  Final diagnoses:  None      NEW MEDICATIONS  STARTED DURING THIS VISIT:  New Prescriptions   No medications on file     Note:  This document was prepared using Dragon voice recognition software and may include unintentional dictation errors.    Maurilio LovelyNoelle Ismeal Heider, MD 01/03/16 [phone]

## 2016-01-03 NOTE — ED Triage Notes (Signed)
Pt sent from dr. Marca Anconalatta's office for eval of chest pain and cough.  Pt has left side chest pressure for 1.5 weeks.  Pt also reports intermittent cough.  Hx collapsed lung x 2.  Pt reports nausea.   Pt has had pain in right jaw.  No pain in jaw now.  Pt alert.  Speech clear.

## 2016-01-03 NOTE — ED Notes (Signed)
Pt states, "my chest was hurting, felt tight, also had pain in jaw. The pain seems to come and go." Pt states she had nausea but no vomiting. Pt states she is uncomfortable. Pt denies any complaints of symptoms at this time. Pt says she has been taking 3 Advil 3 x a day.

## 2016-01-03 NOTE — Progress Notes (Signed)
BP 140/80   Pulse 98   Temp 98.4 F (36.9 C)   Wt 139 lb (63 kg)   SpO2 93%   BMI 21.77 kg/m    Subjective:    Patient ID: Eileen Novak, female    DOB: 28-Mar-1961, 55 y.o.   MRN: [id number]  HPI: Eileen Novak is a 55 y.o. female  Chief Complaint  Patient presents with  . Cough  . Chest Pain   Patient is here for an acute visit; she told her CMA who was working her up that she had chest pain and was worried about her heart; she has also has collapsed lung in the past; she has been hurting in her chest for the last several days; some radiation to the right side; also coughing; she has tried NSAID with some relief; she also voiced worry about an ulcer  Depression screen [facility] 2/9 01/03/2016 08/29/2015  Decreased Interest 0 1  Down, Depressed, Hopeless 0 1  PHQ - 2 Score 0 2  Altered sleeping - 3  Tired, decreased energy - 0  Change in appetite - 0  Feeling bad or failure about yourself  - 1  Trouble concentrating - 0  Moving slowly or fidgety/restless - 0  Suicidal thoughts - 0  PHQ-9 Score - 6  Difficult doing work/chores - Not difficult at all   Relevant past medical, surgical, family and social history reviewed Past Medical History:  Diagnosis Date  . Allergy   . Anxiety   . Depression   . Shoulder pain   . Tobacco abuse 01/07/2016  MD note: hx of collapsed lung; tobacco abuse  Past Surgical History:  Procedure Laterality Date  . FACIAL FRACTURE SURGERY    . FOOT SURGERY     Family History  Problem Relation Age of Onset  . Cancer Mother     breast  . Depression Mother   . Heart disease Father   . Depression Brother   . ADD / ADHD Brother    Social History  Substance Use Topics  . Smoking status: Current Every Day Smoker    Packs/day: 1.00    Years: 40.00    Types: Cigarettes  . Smokeless tobacco: Never Used  . Alcohol use 0.0 oz/week     Comment: occasionally   Interim medical history since last visit reviewed. Allergies and medications  reviewed  Review of Systems Per HPI unless specifically indicated above     Objective:    BP 140/80   Pulse 98   Temp 98.4 F (36.9 C)   Wt 139 lb (63 kg)   SpO2 93%   BMI 21.77 kg/m   Wt Readings from Last 3 Encounters:  01/03/16 139 lb (63 kg)  01/03/16 139 lb (63 kg)  10/27/15 134 lb (60.8 kg)    Physical Exam  Constitutional: She appears well-developed and well-nourished. No distress.  Appears anxious, nontoxic  Cardiovascular: Normal rate and regular rhythm.   Pulmonary/Chest: Effort normal and breath sounds normal.  Skin: She is not diaphoretic.  Psychiatric: Her mood appears anxious. She does not exhibit a depressed mood.   Results for orders placed or performed in visit on 10/27/15  CBC with Differential/Platelet  Result Value Ref Range   WBC 7.8 3.8 - 10.8 K/uL   RBC 4.14 3.80 - 5.10 MIL/uL   Hemoglobin 13.0 11.7 - 15.5 g/dL   HCT 40.2 35.0 - 45.0 %   MCV 97.1 80.0 - 100.0 fL   MCH 31.4 27.0 - 33.0 pg  MCHC 32.3 32.0 - 36.0 g/dL   RDW 12.9 11.0 - 15.0 %   Platelets 334 140 - 400 K/uL   MPV 10.0 7.5 - 12.5 fL   Neutro Abs 4,524 1,500 - 7,800 cells/uL   Lymphs Abs 2,574 850 - 3,900 cells/uL   Monocytes Absolute 468 200 - 950 cells/uL   Eosinophils Absolute 234 15 - 500 cells/uL   Basophils Absolute 0 0 - 200 cells/uL   Neutrophils Relative % 58 %   Lymphocytes Relative 33 %   Monocytes Relative 6 %   Eosinophils Relative 3 %   Basophils Relative 0 %   Smear Review Criteria for review not met   Comprehensive metabolic panel  Result Value Ref Range   Sodium 142 135 - 146 mmol/L   Potassium 4.4 3.5 - 5.3 mmol/L   Chloride 108 98 - 110 mmol/L   CO2 23 20 - 31 mmol/L   Glucose, Bld 86 65 - 99 mg/dL   BUN 27 (H) 7 - 25 mg/dL   Creat 1.16 (H) 0.50 - 1.05 mg/dL   Total Bilirubin 0.2 0.2 - 1.2 mg/dL   Alkaline Phosphatase 76 33 - 130 U/L   AST 22 10 - 35 U/L   ALT 13 6 - 29 U/L   Total Protein 6.6 6.1 - 8.1 g/dL   Albumin 4.3 3.6 - 5.1 g/dL   Calcium  9.8 8.6 - 10.4 mg/dL  Amylase  Result Value Ref Range   Amylase 110 (H) 0 - 105 U/L  Lipase  Result Value Ref Range   Lipase 49 7 - 60 U/L      Assessment & Plan:   Problem List Items Addressed This Visit      Other   Tobacco abuse    Increases risk of cardiopulmonary etiologies for her chest pain; to ER; patient refused ambulance transport      Chest pain    ddx includes pneumothorax, DVT, gastritis, angina, pericarditis, pneumonia, etc It was decided this was not the appropriate location to work up her symptoms; she was urged to get evaluated in the ER; she declined ambulance transport and said she would go by private vehicle       Other Visit Diagnoses   None.     Follow up plan: No Follow-up on file.  An after-visit summary was printed and given to the patient at [place].  Please see the patient instructions which may contain other information and recommendations beyond what is mentioned above in the assessment and plan.  Meds ordered this encounter  Medications  . cyclobenzaprine (FLEXERIL) 5 MG tablet    Sig: Take by mouth.  . meloxicam (MOBIC) 15 MG tablet    Sig: Take by mouth.

## 2016-01-03 NOTE — ED Notes (Signed)
Pt. Verbalizes understanding of d/c instructions, and follow-up. VS stable and pain controlled per pt.  Pt. In NAD at time of d/c and denies further concerns regarding this visit. Pt. Ambulatory out of the unit with steady gait. Pt advised to return to the ED at any time for emergent concerns, or for new/worsening symptoms.

## 2016-01-05 ENCOUNTER — Encounter: Payer: Self-pay | Admitting: Family Medicine

## 2016-01-07 ENCOUNTER — Encounter: Payer: Self-pay | Admitting: Family Medicine

## 2016-01-07 DIAGNOSIS — R079 Chest pain, unspecified: Secondary | ICD-10-CM | POA: Insufficient documentation

## 2016-01-07 DIAGNOSIS — Z720 Tobacco use: Secondary | ICD-10-CM

## 2016-01-07 DIAGNOSIS — Z87891 Personal history of nicotine dependence: Secondary | ICD-10-CM | POA: Insufficient documentation

## 2016-01-07 HISTORY — DX: Tobacco use: Z72.0

## 2016-01-07 NOTE — Assessment & Plan Note (Signed)
Increases risk of cardiopulmonary etiologies for her chest pain; to ER; patient refused ambulance transport

## 2016-01-07 NOTE — Assessment & Plan Note (Signed)
ddx includes pneumothorax, DVT, gastritis, angina, pericarditis, pneumonia, etc It was decided this was not the appropriate location to work up her symptoms; she was urged to get evaluated in the ER; she declined ambulance transport and said she would go by private vehicle

## 2016-01-23 ENCOUNTER — Ambulatory Visit: Payer: Medicaid Other | Admitting: Family Medicine

## 2016-07-12 ENCOUNTER — Emergency Department: Payer: Medicaid Other

## 2016-07-12 ENCOUNTER — Emergency Department
Admission: EM | Admit: 2016-07-12 | Discharge: 2016-07-12 | Disposition: A | Discharge status: Home / Self Care | Payer: Medicaid Other | Attending: Emergency Medicine | Admitting: Emergency Medicine

## 2016-07-12 ENCOUNTER — Encounter: Payer: Self-pay | Admitting: Emergency Medicine

## 2016-07-12 DIAGNOSIS — Y939 Activity, unspecified: Secondary | ICD-10-CM | POA: Diagnosis not present

## 2016-07-12 DIAGNOSIS — S63501A Unspecified sprain of right wrist, initial encounter: Secondary | ICD-10-CM | POA: Diagnosis not present

## 2016-07-12 DIAGNOSIS — Y999 Unspecified external cause status: Secondary | ICD-10-CM | POA: Diagnosis not present

## 2016-07-12 DIAGNOSIS — Y929 Unspecified place or not applicable: Secondary | ICD-10-CM | POA: Diagnosis not present

## 2016-07-12 DIAGNOSIS — W010XXA Fall on same level from slipping, tripping and stumbling without subsequent striking against object, initial encounter: Secondary | ICD-10-CM | POA: Diagnosis not present

## 2016-07-12 DIAGNOSIS — S6991XA Unspecified injury of right wrist, hand and finger(s), initial encounter: Secondary | ICD-10-CM | POA: Diagnosis present

## 2016-07-12 DIAGNOSIS — F17210 Nicotine dependence, cigarettes, uncomplicated: Secondary | ICD-10-CM | POA: Insufficient documentation

## 2016-07-12 DIAGNOSIS — S63616A Unspecified sprain of right little finger, initial encounter: Secondary | ICD-10-CM | POA: Diagnosis not present

## 2016-07-12 DIAGNOSIS — S42211A Unspecified displaced fracture of surgical neck of right humerus, initial encounter for closed fracture: Secondary | ICD-10-CM | POA: Diagnosis not present

## 2016-07-12 MED ORDER — OXYCODONE-ACETAMINOPHEN 7.5-325 MG PO TABS: 1.0000 | ORAL_TABLET | Freq: Four times a day (QID) | ORAL | 0 refills | Status: DC | PRN

## 2016-07-12 MED ORDER — TRAMADOL HCL 50 MG PO TABS: 50.0000 mg | ORAL_TABLET | Freq: Once | ORAL | Status: DC

## 2016-07-12 MED ORDER — KETOROLAC TROMETHAMINE 60 MG/2ML IM SOLN
60.0000 mg | Freq: Once | INTRAMUSCULAR | Status: AC
  Administered 2016-07-12: 60 mg via INTRAMUSCULAR
  Filled 2016-07-12: qty 2

## 2016-07-12 MED ORDER — HYDROMORPHONE HCL 1 MG/ML IJ SOLN
1.0000 mg | Freq: Once | INTRAMUSCULAR | Status: AC
  Administered 2016-07-12: 1 mg via INTRAMUSCULAR
  Filled 2016-07-12: qty 1

## 2016-07-12 MED ORDER — ONDANSETRON HCL 8 MG PO TABS: 8.0000 mg | ORAL_TABLET | Freq: Three times a day (TID) | ORAL | 0 refills | Status: DC | PRN

## 2016-07-12 NOTE — Discharge Instructions (Signed)
Wear wrist and finger splint for 3-5 days. Wear shoulder sling until evaluated by ortho clinic. May remove for showering as long as the arm is held still.

## 2016-07-12 NOTE — ED Triage Notes (Signed)
Brought in via ems s/p fall   [facility]  Having pain to right shoulder ,right 5 th finger and foot

## 2016-07-12 NOTE — ED Provider Notes (Signed)
[facility] Emergency Department Provider Note   ____________________________________________   None    (approximate)  I have reviewed the triage vital signs and the nursing notes.   HISTORY  Chief Complaint Fall    HPI Eileen Novak is a 56 y.o. female patient complain of pain to the right upper arm, right hand, and right foot secondary to a trip and fall. Incident occurred at [employer]. Patient arrived via EMS. Patient rates the pain as 7/10. Patient describes the pain as "sharp". No palliative measures prior to arrival.   Past Medical History:  Diagnosis Date  . Allergy   . Anxiety   . Depression   . Shoulder pain   . Tobacco abuse 01/07/2016    Patient Active Problem List   Diagnosis Date Noted  . Chest pain 01/07/2016  . Tobacco abuse 01/07/2016  . Right upper quadrant abdominal pain 10/27/2015  . 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 10/27/2015  . Constipation 09/06/2015  . Anxiety disorder 08/29/2015  . Pain of right foot 08/29/2015  . Chronic left shoulder pain 08/29/2015  . Neck pain, chronic 08/29/2015  . Bilateral thoracic back pain 08/29/2015  . DEPRESSION 12/03/2006  . GERD 12/03/2006    Past Surgical History:  Procedure Laterality Date  . FACIAL FRACTURE SURGERY    . FOOT SURGERY      Prior to Admission medications   Medication Sig Start Date End Date Taking? Authorizing Provider  cyclobenzaprine (FLEXERIL) 5 MG tablet Take by mouth. 12/06/15   Historical Provider, MD  meloxicam (MOBIC) 15 MG tablet Take by mouth. 12/06/15   Historical Provider, MD  oxyCODONE-acetaminophen (PERCOCET) 7.5-325 MG tablet Take 1 tablet by mouth every 6 (six) hours as needed for severe pain. 07/12/16   Joni Reiningonald K Bronnie Vasseur, PA-C    Allergies Escitalopram oxalate and Penicillins  Family History  Problem Relation Age of Onset  . Cancer Mother     breast  . Depression Mother   . Heart disease Father   . Depression Brother   . ADD / ADHD Brother      Social History Social History  Substance Use Topics  . Smoking status: Current Every Day Smoker    Packs/day: 1.00    Years: 40.00    Types: Cigarettes  . Smokeless tobacco: Never Used  . Alcohol use 0.0 oz/week     Comment: occasionally    Review of Systems Constitutional: No fever/chills Eyes: No visual changes. ENT: No sore throat. Cardiovascular: Denies chest pain. Respiratory: Denies shortness of breath. Gastrointestinal: No abdominal pain.  No nausea, no vomiting.  No diarrhea.  No constipation. Genitourinary: Negative for dysuria. Musculoskeletal:Right upper arm, right hand/wrist, and right foot pain.  Skin: Negative for rash. Neurological: Negative for headaches, focal weakness or numbness. Psychiatric: Anxiety and depression ____________________________________________   PHYSICAL EXAM:  VITAL SIGNS: ED Triage Vitals  Enc Vitals Group     BP 07/12/16 1013 105/67     Pulse Rate 07/12/16 1013 70     Resp 07/12/16 1013 17     Temp 07/12/16 1013 97.7 F (36.5 C)     Temp Source 07/12/16 1013 Oral     SpO2 07/12/16 1013 97 %     Weight 07/12/16 1011 129 lb (58.5 kg)     Height 07/12/16 1011 5\' 5"  (1.651 m)     Head Circumference --      Peak Flow --      Pain Score 07/12/16 1011 7     Pain Loc --  Pain Edu? --      Excl. in GC? --     Constitutional: Alert and oriented. Well appearing and in no acute distress. Eyes: Conjunctivae are normal. PERRL. EOMI. Head: Atraumatic. Nose: No congestion/rhinnorhea. Mouth/Throat: Mucous membranes are moist.  Oropharynx non-erythematous. Neck: No stridor.  No cervical spine tenderness to palpation. Hematological/Lymphatic/Immunilogical: No cervical lymphadenopathy. Cardiovascular: Normal rate, regular rhythm. Grossly normal heart sounds.  Good peripheral circulation. Respiratory: Normal respiratory effort.  No retractions. Lungs CTAB. Gastrointestinal: Soft and nontender. No distention. No abdominal bruits. No  CVA tenderness. Musculoskeletal: No obvious deformity to the affected extremities. Patient has  moderate guarding palpation mid shaft humerus, fifth digit right hand, and fourth and fifth digit right foot. Neurologic:  Normal speech and language. No gross focal neurologic deficits are appreciated. No gait instability. Skin:  Skin is warm, dry and intact. No rash noted. Psychiatric: Mood and affect are normal. Speech and behavior are normal.  ____________________________________________   LABS (all labs ordered are listed, but only abnormal results are displayed)  Labs Reviewed - No data to display ____________________________________________  EKG   ____________________________________________  RADIOLOGY  X-rays show mildly displaced right humeral neck fracture fracture ____________________________________________   PROCEDURES  Procedure(s) performed: None  Procedures  Critical Care performed: No  ____________________________________________   INITIAL IMPRESSION / ASSESSMENT AND PLAN / ED COURSE  Pertinent labs & imaging results that were available during my care of the patient were reviewed by me and considered in my medical decision making (see chart for details).  Right humeral neck fracture, sprain right wrist, sprain right fifth finger, and toe pain secondary to fall. Discussed or so. Patient will be placed in a shoulder immobilizer and advised to make an appointment to follow-up with orthopedic clinic in 5 days.   ____________________________________________   FINAL CLINICAL IMPRESSION(S) / ED DIAGNOSES  Final diagnoses:  Closed displaced fracture of surgical neck of right humerus, unspecified fracture morphology, initial encounter      NEW MEDICATIONS STARTED DURING THIS VISIT:  New Prescriptions   OXYCODONE-ACETAMINOPHEN (PERCOCET) 7.5-325 MG TABLET    Take 1 tablet by mouth every 6 (six) hours as needed for severe pain.     Note:  This document  was prepared using Dragon voice recognition software and may include unintentional dictation errors.    Joni Reining, PA-C 07/12/16 1214    Governor Rooks, MD 07/12/16 [phone]

## 2016-07-20 ENCOUNTER — Other Ambulatory Visit: Payer: Self-pay | Admitting: Physician Assistant

## 2016-07-20 DIAGNOSIS — S42221A 2-part displaced fracture of surgical neck of right humerus, initial encounter for closed fracture: Secondary | ICD-10-CM

## 2016-07-26 ENCOUNTER — Ambulatory Visit
Admission: RE | Admit: 2016-07-26 | Discharge: 2016-07-26 | Disposition: A | Discharge status: Home / Self Care | Payer: No Typology Code available for payment source | Source: Ambulatory Visit | Attending: Family Medicine | Admitting: Family Medicine

## 2016-07-26 ENCOUNTER — Ambulatory Visit
Admission: RE | Admit: 2016-07-26 | Discharge: 2016-07-26 | Disposition: A | Discharge status: Home / Self Care | Payer: Medicaid Other | Source: Ambulatory Visit | Attending: Physician Assistant | Admitting: Physician Assistant

## 2016-07-26 ENCOUNTER — Other Ambulatory Visit: Payer: Self-pay | Admitting: Physician Assistant

## 2016-07-26 ENCOUNTER — Ambulatory Visit
Admission: RE | Admit: 2016-07-26 | Discharge: 2016-07-26 | Disposition: A | Discharge status: Home / Self Care | Payer: Medicaid Other | Source: Ambulatory Visit | Attending: Family Medicine | Admitting: Family Medicine

## 2016-07-26 ENCOUNTER — Encounter: Payer: Self-pay | Admitting: Family Medicine

## 2016-07-26 ENCOUNTER — Ambulatory Visit (INDEPENDENT_AMBULATORY_CARE_PROVIDER_SITE_OTHER): Payer: Medicaid Other | Admitting: Family Medicine

## 2016-07-26 VITALS — BP 130/68 | Temp 98.5°F | Wt 136.7 lb

## 2016-07-26 DIAGNOSIS — M47812 Spondylosis without myelopathy or radiculopathy, cervical region: Secondary | ICD-10-CM | POA: Diagnosis not present

## 2016-07-26 DIAGNOSIS — Z9882 Breast implant status: Secondary | ICD-10-CM | POA: Diagnosis not present

## 2016-07-26 DIAGNOSIS — X58XXXA Exposure to other specified factors, initial encounter: Secondary | ICD-10-CM | POA: Diagnosis not present

## 2016-07-26 DIAGNOSIS — S42221A 2-part displaced fracture of surgical neck of right humerus, initial encounter for closed fracture: Secondary | ICD-10-CM | POA: Insufficient documentation

## 2016-07-26 DIAGNOSIS — M542 Cervicalgia: Secondary | ICD-10-CM | POA: Insufficient documentation

## 2016-07-26 DIAGNOSIS — K130 Diseases of lips: Secondary | ICD-10-CM | POA: Diagnosis not present

## 2016-07-26 DIAGNOSIS — S42201D Unspecified fracture of upper end of right humerus, subsequent encounter for fracture with routine healing: Secondary | ICD-10-CM | POA: Diagnosis not present

## 2016-07-26 DIAGNOSIS — R6889 Other general symptoms and signs: Secondary | ICD-10-CM

## 2016-07-26 DIAGNOSIS — R911 Solitary pulmonary nodule: Secondary | ICD-10-CM | POA: Diagnosis not present

## 2016-07-26 NOTE — Patient Instructions (Addendum)
Take 1,000 iu of vitamin D3 daily Check into the availability of powdered aluminum tetrahydrochloride antiperspirant Let your orthopaedist know about all of your pain Have the xray done across the street
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
## 2016-08-09 ENCOUNTER — Telehealth: Payer: Self-pay

## 2016-08-09 ENCOUNTER — Telehealth: Payer: Self-pay | Admitting: Family Medicine

## 2016-08-09 NOTE — Telephone Encounter (Signed)
Left a detail messaged  

## 2016-08-09 NOTE — Telephone Encounter (Signed)
Half strength or quarter strength hydrogen peroxide to clean may be gentler than alcohol Neosporin or polysporin Mychart visit or urgent care if getting worse

## 2016-08-09 NOTE — Telephone Encounter (Signed)
Spoke with pt and she stated she is using triple antibiotic and she pours the quarter strength hydrogen peroxide on the area because she can not touch it without burning it and or cause more pain.
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
## 2016-08-18 DIAGNOSIS — S42209A Unspecified fracture of upper end of unspecified humerus, initial encounter for closed fracture: Secondary | ICD-10-CM | POA: Insufficient documentation

## 2016-08-18 NOTE — Assessment & Plan Note (Signed)
Managed by orthopaedist 

## 2016-12-07 ENCOUNTER — Encounter: Payer: Self-pay | Admitting: Family Medicine

## 2016-12-07 ENCOUNTER — Ambulatory Visit (INDEPENDENT_AMBULATORY_CARE_PROVIDER_SITE_OTHER): Payer: Medicaid Other | Admitting: Family Medicine

## 2016-12-07 VITALS — BP 124/62 | HR 112 | Temp 97.8°F | Resp 16 | Wt 127.3 lb

## 2016-12-07 DIAGNOSIS — R000 Tachycardia, unspecified: Secondary | ICD-10-CM | POA: Diagnosis not present

## 2016-12-07 DIAGNOSIS — M546 Pain in thoracic spine: Secondary | ICD-10-CM

## 2016-12-07 DIAGNOSIS — N644 Mastodynia: Secondary | ICD-10-CM

## 2016-12-07 DIAGNOSIS — M25551 Pain in right hip: Secondary | ICD-10-CM

## 2016-12-07 DIAGNOSIS — G8929 Other chronic pain: Secondary | ICD-10-CM

## 2016-12-07 DIAGNOSIS — N6321 Unspecified lump in the left breast, upper outer quadrant: Secondary | ICD-10-CM | POA: Diagnosis not present

## 2016-12-07 DIAGNOSIS — M79671 Pain in right foot: Secondary | ICD-10-CM | POA: Diagnosis not present

## 2016-12-07 DIAGNOSIS — N6325 Unspecified lump in the left breast, overlapping quadrants: Secondary | ICD-10-CM

## 2016-12-07 DIAGNOSIS — N6320 Unspecified lump in the left breast, unspecified quadrant: Secondary | ICD-10-CM | POA: Diagnosis not present

## 2016-12-07 DIAGNOSIS — Z5181 Encounter for therapeutic drug level monitoring: Secondary | ICD-10-CM | POA: Diagnosis not present

## 2016-12-07 DIAGNOSIS — Z87891 Personal history of nicotine dependence: Secondary | ICD-10-CM | POA: Diagnosis not present

## 2016-12-07 NOTE — Assessment & Plan Note (Deleted)
Encouraged cessation, see AVS 

## 2016-12-07 NOTE — Patient Instructions (Addendum)
Avoid caffeine We'll get you to see the foot doctor and the back doctor We'll get breast imaging If you have not heard anything from my staff in a week about any orders/referrals/studies from today, please contact us here to follow-up [phone] Please return next week for labs

## 2016-12-07 NOTE — Assessment & Plan Note (Signed)
Refer to podiatrist 

## 2016-12-07 NOTE — Progress Notes (Signed)
BP 124/62   Pulse (!) 112   Temp 97.8 F (36.6 C) (Oral)   Resp 16   Wt 127 lb 4.8 oz (57.7 kg)   SpO2 98%   BMI 21.18 kg/m    Subjective:    Patient ID: Eileen Novak, female    DOB: Apr 30, 1961, 56 y.o.   MRN: [id number]  HPI: Eileen Novak is a 56 y.o. female  Chief Complaint  Patient presents with  . Breast Problem    Breast has pain left. patient have Implants for 20 years.   . Back Pain    on and off middle pain   . Foot Injury    Broke right foot in march 2018 and at 28 and pt crush her heel. Pt heel start giving her problems when she broke her foot in march 2018.    HPI She does not want to deal with the breast issues; she wants new implants; hard and lays on the rib and sore to the touch; does not have a breast surgeon; due for mammogram; they have done ultrasounds; last mammogram and [place] were 2015, but she can't remember  Back pain; hurts so bad across the middle; thinks it is from the fall; worse when folding clothes; pointing and reaching back with the right arm painful and limited; limited ROM with the right shoulder; she sees Dr. Sheppard Penton and he knows about this; hard to put things up high she says; fell on March 8th; thoracic region between and below shoulders blades; they gave her pain medicine for her shoulder, but allergic, so didn't take it; took tylenol and aleve; takes a BC too at times; takes zantac for the stomach upset and tums; she needs referral back to Dr. Lyndal Rainbow and broke her shoulder in march 2018 Crushed her right heel at age 75; had been good for years until she fell again in March 2018; now heel hurts; no redness; swelling medial and lateral  Feels like a pain that is a shock going down the left leg, on the lateral aspect; not often; every two weeks, no preciptiating factor; not sure if stress; has had it before, maybe two years duration, not that long maybe, maybe last six months, no B/B dysfunction  Cramp on the RLQ; lasts 10 minutes; holding  it; not brought on by anything; not stabbing; like a period; no vaginal bleeding; LMP was years ago; no fevers; no blood in the stool; going on for maybe a month  Depression screen [facility] 2/9 12/07/2016 07/26/2016 01/03/2016 08/29/2015  Decreased Interest 0 0 0 1  Down, Depressed, Hopeless 0 0 0 1  PHQ - 2 Score 0 0 0 2  Altered sleeping - - - 3  Tired, decreased energy - - - 0  Change in appetite - - - 0  Feeling bad or failure about yourself  - - - 1  Trouble concentrating - - - 0  Moving slowly or fidgety/restless - - - 0  Suicidal thoughts - - - 0  PHQ-9 Score - - - 6  Difficult doing work/chores - - - Not difficult at all    Relevant past medical, surgical, family and social history reviewed Past Medical History:  Diagnosis Date  . Allergy   . Anxiety   . Depression   . Shoulder pain   . Tobacco abuse 01/07/2016   Past Surgical History:  Procedure Laterality Date  . FACIAL FRACTURE SURGERY    . FOOT SURGERY     Family History  Problem Relation Age of Onset  . Cancer Mother        breast  . Depression Mother   . Heart disease Father   . Depression Brother   . ADD / ADHD Brother    Social History   Social History  . Marital status: Legally Separated    Spouse name: N/A  . Number of children: N/A  . Years of education: N/A   Occupational History  . Not on file.   Social History Main Topics  . Smoking status: Former Smoker    Packs/day: 1.00    Years: 40.00    Types: Cigarettes    Quit date: 05/28/2016  . Smokeless tobacco: Never Used  . Alcohol use 0.0 oz/week     Comment: occasionally  . Drug use: No  . Sexual activity: Yes   Other Topics Concern  . Not on file   Social History Narrative  . No narrative on file    Interim medical history since last visit reviewed. Allergies and medications reviewed  Review of Systems Per HPI unless specifically indicated above     Objective:    BP 124/62   Pulse (!) 112   Temp 97.8 F (36.6 C) (Oral)   Resp  16   Wt 127 lb 4.8 oz (57.7 kg)   SpO2 98%   BMI 21.18 kg/m   Wt Readings from Last 3 Encounters:  12/07/16 127 lb 4.8 oz (57.7 kg)  07/26/16 136 lb 11.2 oz (62 kg)  07/12/16 129 lb (58.5 kg)    Physical Exam  Constitutional: She appears well-developed and well-nourished.  HENT:  Mouth/Throat: Mucous membranes are normal.  Eyes: EOM are normal. No scleral icterus.  Cardiovascular: Regular rhythm.   No extrasystoles are present. Tachycardia present.   Pulmonary/Chest: Effort normal and breath sounds normal. Left breast exhibits mass (3 o'clock position).  Bilateral breast implants; both are hard, lower aspect of left breast hard; no overlying skin changes (no dimpling or peau d'orange changes); no nipple discharge  Lymphadenopathy:    She has no axillary adenopathy.  Psychiatric: She has a normal mood and affect. Her behavior is normal. Her mood appears not anxious. She does not exhibit a depressed mood.      Assessment & Plan:   Problem List Items Addressed This Visit      Other   Tachycardia    Check TSH and CBC      Relevant Orders   TSH (Completed)   CBC with Differential/Platelet (Completed)   T4, free (Completed)   Pain of right foot    Refer to podiatrist      Hx of smoking   Bilateral thoracic back pain    [facility]      Relevant Medications   Aspirin-Salicylamide-Caffeine (BC HEADACHE POWDER PO)   acetaminophen (TYLENOL) 325 MG tablet   naproxen sodium (ANAPROX) 220 MG tablet   Other Relevant Orders   Ambulatory referral to Orthopedic Surgery   Medication monitoring encounter    Check liver and kidney function      Relevant Orders   COMPLETE METABOLIC PANEL WITH GFR (Completed)    Other Visit Diagnoses    Heel pain, chronic, right    -  Primary   Relevant Medications   Aspirin-Salicylamide-Caffeine (BC HEADACHE POWDER PO)   acetaminophen (TYLENOL) 325 MG tablet   naproxen sodium (ANAPROX) 220 MG tablet   Other Relevant Orders    Ambulatory referral to Podiatry   Chronic foot pain,  right       Relevant Medications   Aspirin-Salicylamide-Caffeine (BC HEADACHE POWDER PO)   acetaminophen (TYLENOL) 325 MG tablet   naproxen sodium (ANAPROX) 220 MG tablet   Other Relevant Orders   Ambulatory referral to Podiatry   Pelvic joint pain, right       Relevant Orders   Urinalysis w microscopic + reflex cultur (Completed)   US Transvaginal Non-OB (Completed)   US Pelvis Complete (Completed)   Breast pain, left       Relevant Orders   MM DIAG BREAST W/IMPLANT TOMO BILATERAL   US BREAST LTD UNI LEFT INC AXILLA   Breast lump on left side at 3 o'clock position       Relevant Orders   MM DIAG BREAST W/IMPLANT TOMO BILATERAL   US BREAST LTD UNI LEFT INC AXILLA       Follow up plan: No Follow-up on file.  An after-visit summary was printed and given to the patient at check-out.  Please see the patient instructions which may contain other information and recommendations beyond what is mentioned above in the assessment and plan.  Meds ordered this encounter  Medications  . ranitidine (ZANTAC) 150 MG tablet    Sig: Take 150 mg by mouth as needed for heartburn.  Marland Kitchen. omeprazole (PRILOSEC) 20 MG capsule    Sig: Take 20 mg by mouth as needed.  . Aspirin-Salicylamide-Caffeine (BC HEADACHE POWDER PO)    Sig: Take by mouth as needed.  Marland Kitchen. acetaminophen (TYLENOL) 325 MG tablet    Sig: Take 650 mg by mouth as needed.  . naproxen sodium (ANAPROX) 220 MG tablet    Sig: Take 220 mg by mouth as needed.  . calcium carbonate (TUMS - DOSED IN MG ELEMENTAL CALCIUM) 500 MG chewable tablet    Sig: Chew 1 tablet by mouth as needed for indigestion or heartburn.    Orders Placed This Encounter  Procedures  . US Transvaginal Non-OB  . US Pelvis Complete  . MM DIAG BREAST W/IMPLANT TOMO BILATERAL  . US BREAST LTD UNI LEFT INC AXILLA  . Urinalysis w microscopic + reflex cultur  . TSH  . CBC with Differential/Platelet  . T4, free  .  COMPLETE METABOLIC PANEL WITH GFR  . Ambulatory referral to Orthopedic Surgery  . Ambulatory referral to Podiatry

## 2016-12-07 NOTE — Assessment & Plan Note (Signed)
Refer to [facility] at [facility]

## 2016-12-07 NOTE — Assessment & Plan Note (Signed)
Check TSH and CBC

## 2016-12-08 LAB — URINALYSIS W MICROSCOPIC + REFLEX CULTURE
Bacteria, UA: NONE SEEN [HPF]
Bilirubin Urine: NEGATIVE
Glucose, UA: NEGATIVE
Hgb urine dipstick: NEGATIVE
Ketones, ur: NEGATIVE
Leukocytes, UA: NEGATIVE
Nitrite: NEGATIVE
Protein, ur: NEGATIVE
SPECIFIC GRAVITY, URINE: 1.026 (ref 1.001–1.035)
YEAST: NONE SEEN [HPF]
pH: 5 (ref 5.0–8.0)

## 2016-12-13 ENCOUNTER — Other Ambulatory Visit: Payer: Self-pay

## 2016-12-13 ENCOUNTER — Telehealth: Payer: Self-pay

## 2016-12-13 ENCOUNTER — Telehealth: Payer: Self-pay | Admitting: Family Medicine

## 2016-12-13 DIAGNOSIS — Z5181 Encounter for therapeutic drug level monitoring: Secondary | ICD-10-CM | POA: Insufficient documentation

## 2016-12-13 DIAGNOSIS — R000 Tachycardia, unspecified: Secondary | ICD-10-CM

## 2016-12-13 LAB — T4, FREE: Free T4: 1.9 ng/dL — ABNORMAL HIGH (ref 0.8–1.8)

## 2016-12-13 LAB — CBC WITH DIFFERENTIAL/PLATELET
Basophils Absolute: 0 cells/uL (ref 0–200)
Basophils Relative: 0 %
Eosinophils Absolute: 138 cells/uL (ref 15–500)
Eosinophils Relative: 2 %
HCT: 35 % (ref 35.0–45.0)
Hemoglobin: 11.5 g/dL — ABNORMAL LOW (ref 11.7–15.5)
LYMPHS PCT: 38 %
Lymphs Abs: 2622 cells/uL (ref 850–3900)
MCH: 30.7 pg (ref 27.0–33.0)
MCHC: 32.9 g/dL (ref 32.0–36.0)
MCV: 93.3 fL (ref 80.0–100.0)
MONO ABS: 414 {cells}/uL (ref 200–950)
MPV: 9.4 fL (ref 7.5–12.5)
Monocytes Relative: 6 %
NEUTROS PCT: 54 %
Neutro Abs: 3726 cells/uL (ref 1500–7800)
Platelets: 291 10*3/uL (ref 140–400)
RBC: 3.75 MIL/uL — AB (ref 3.80–5.10)
RDW: 12.7 % (ref 11.0–15.0)
WBC: 6.9 10*3/uL (ref 3.8–10.8)

## 2016-12-13 LAB — COMPLETE METABOLIC PANEL WITH GFR
ALT: 19 U/L (ref 6–29)
AST: 19 U/L (ref 10–35)
Albumin: 3.8 g/dL (ref 3.6–5.1)
Alkaline Phosphatase: 114 U/L (ref 33–130)
BUN: 28 mg/dL — ABNORMAL HIGH (ref 7–25)
CALCIUM: 10 mg/dL (ref 8.6–10.4)
CO2: 25 mmol/L (ref 20–32)
Chloride: 108 mmol/L (ref 98–110)
Creat: 0.92 mg/dL (ref 0.50–1.05)
GFR, EST NON AFRICAN AMERICAN: 70 mL/min (ref >=60)
GFR, Est African American: 81 mL/min (ref >=60)
GLUCOSE: 117 mg/dL — AB (ref 65–99)
POTASSIUM: 4.2 mmol/L (ref 3.5–5.3)
SODIUM: 143 mmol/L (ref 135–146)
Total Bilirubin: 0.2 mg/dL (ref 0.2–1.2)
Total Protein: 5.9 g/dL — ABNORMAL LOW (ref 6.1–8.1)

## 2016-12-13 LAB — TSH: TSH: 0.01 m[IU]/L — AB

## 2016-12-13 NOTE — Telephone Encounter (Signed)
Pt called and asked for a call back about Labs that she is suppposed to do.

## 2016-12-13 NOTE — Telephone Encounter (Signed)
Thank you I've entered orders for her and she can come in any time today or tomorrow for those She does not need to be fasting Also, please verify her med list She has two types of NSAIDs on her list (meloxicam and naproxen), plus the BC powders; she should only be on ONE of the three

## 2016-12-13 NOTE — Telephone Encounter (Signed)
In your notes you want to check her CBC and TSH, is that correct and if so what DX.

## 2016-12-13 NOTE — Telephone Encounter (Signed)
Patient was informed that she has been scheduled for her US on 12/14/16 @ [facility]. Patient was advised to arrive 15 mins early and to start drinking 32oz of water 30 mins before but to be finished within 30mins without voiding.  Patient expressed verbal understanding and said thanks.

## 2016-12-13 NOTE — Assessment & Plan Note (Signed)
Check liver and kidney function 

## 2016-12-13 NOTE — Telephone Encounter (Signed)
Went over her med list she states that she has taken meloxcaim couple of weeks ago. She ran out couple of days ago and she don't have anymore. She try not to but she does.  Read over her med list just to be sure; [facility] and naproxen as needed. She states when she is on her feet a lot her back hurts and she takes them. She is aware that she should not take them back to back but she states at times she just can not help it ( pt own words).

## 2016-12-14 ENCOUNTER — Other Ambulatory Visit: Payer: Self-pay

## 2016-12-14 ENCOUNTER — Ambulatory Visit
Admission: RE | Admit: 2016-12-14 | Discharge: 2016-12-14 | Disposition: A | Discharge status: Home / Self Care | Payer: Medicaid Other | Source: Ambulatory Visit | Attending: Family Medicine | Admitting: Family Medicine

## 2016-12-14 DIAGNOSIS — D649 Anemia, unspecified: Secondary | ICD-10-CM

## 2016-12-14 DIAGNOSIS — M25551 Pain in right hip: Secondary | ICD-10-CM

## 2016-12-14 DIAGNOSIS — E0590 Thyrotoxicosis, unspecified without thyrotoxic crisis or storm: Secondary | ICD-10-CM

## 2016-12-17 ENCOUNTER — Telehealth: Payer: Self-pay | Admitting: Family Medicine

## 2016-12-17 NOTE — Telephone Encounter (Signed)
PER THE PATIENT SAID THAT A NURSE OF DR LADA CALLED Friday AND SAID THAT SHE NEEDED TO SEE SOMEONE FOR AN OVERACTIVE THYROID BUT THEY DID NOT SAY ANYTHING ABOUT SENDING HER ANYWHERE. SHE HAD MEDICAID TILL THE END OF AUGUST 2018 AND WANTS TO GET THIS DONE BEFORE THEN.

## 2016-12-17 NOTE — Telephone Encounter (Signed)
Called patient back, told her when they call from office to let them know about ins so they can get her in asap

## 2016-12-27 ENCOUNTER — Telehealth: Payer: Self-pay | Admitting: Family Medicine

## 2016-12-27 NOTE — Telephone Encounter (Signed)
Have overactive thyroids. She has had 3 appointments and they keep getting rescheduled by the specialist (sept 11th). Pt states her heart rate is beating really hard and breathing really fast. Did not have anything to check. Asking for a return call to discuss further. She gets off at 2pm today. [phone]

## 2016-12-27 NOTE — Telephone Encounter (Signed)
Pt states she having a over active thyroid symptoms and she would like to get to someone else sooner. Spoke with latisha and she thinks that we have a [facility] endo in [place]. We can fax her information to them and see if they have a n earlier appt. I suggest to the pt to keep her appt with Dr. Rocco Pauls unless the other clinic have earlier appt to make sure she cancel with him.

## 2016-12-31 ENCOUNTER — Telehealth: Payer: Self-pay

## 2016-12-31 NOTE — Telephone Encounter (Signed)
Patient called states you had found crystals in her urine last time she was here.  Now she is having leg pain and its hard for her to walk, wants to know if this can be related?

## 2016-12-31 NOTE — Telephone Encounter (Signed)
Possibly; I recommend an evaluation for her new complaint, either here or at urgent care or ER if severe/urgent

## 2017-01-01 ENCOUNTER — Telehealth: Payer: Self-pay | Admitting: Family Medicine

## 2017-01-01 NOTE — Telephone Encounter (Signed)
Patient notified, has an appt tomorrow

## 2017-01-01 NOTE — Telephone Encounter (Signed)
Informed pt of her mammogram appt on VM

## 2017-01-02 ENCOUNTER — Encounter: Payer: Self-pay | Admitting: Family Medicine

## 2017-01-02 ENCOUNTER — Telehealth: Payer: Self-pay | Admitting: Family Medicine

## 2017-01-02 ENCOUNTER — Ambulatory Visit (INDEPENDENT_AMBULATORY_CARE_PROVIDER_SITE_OTHER): Payer: Medicaid Other | Admitting: Family Medicine

## 2017-01-02 VITALS — BP 112/64 | HR 103 | Temp 97.9°F | Resp 16 | Wt 127.6 lb

## 2017-01-02 DIAGNOSIS — R29898 Other symptoms and signs involving the musculoskeletal system: Secondary | ICD-10-CM

## 2017-01-02 DIAGNOSIS — G8929 Other chronic pain: Secondary | ICD-10-CM | POA: Diagnosis not present

## 2017-01-02 DIAGNOSIS — M546 Pain in thoracic spine: Secondary | ICD-10-CM

## 2017-01-02 DIAGNOSIS — R8290 Unspecified abnormal findings in urine: Secondary | ICD-10-CM

## 2017-01-02 DIAGNOSIS — R82998 Other abnormal findings in urine: Secondary | ICD-10-CM

## 2017-01-02 DIAGNOSIS — R8299 Other abnormal findings in urine: Secondary | ICD-10-CM

## 2017-01-02 DIAGNOSIS — Z23 Encounter for immunization: Secondary | ICD-10-CM

## 2017-01-02 LAB — URINALYSIS W MICROSCOPIC + REFLEX CULTURE
BACTERIA UA: NONE SEEN [HPF]
Bilirubin Urine: NEGATIVE
Casts: NONE SEEN [LPF]
Crystals: NONE SEEN [HPF]
GLUCOSE, UA: NEGATIVE
Hgb urine dipstick: NEGATIVE
Ketones, ur: NEGATIVE
LEUKOCYTES UA: NEGATIVE
NITRITE: NEGATIVE
PROTEIN: NEGATIVE
RBC / HPF: NONE SEEN RBC/HPF (ref <=2)
Specific Gravity, Urine: 1.02 (ref 1.001–1.035)
Squamous Epithelial / LPF: NONE SEEN [HPF] (ref <=5)
WBC, UA: NONE SEEN WBC/HPF (ref <=5)
YEAST: NONE SEEN [HPF]
pH: 5.5 (ref 5.0–8.0)

## 2017-01-02 MED ORDER — NITROFURANTOIN MONOHYD MACRO 100 MG PO CAPS: 100.0000 mg | ORAL_CAPSULE | Freq: Two times a day (BID) | ORAL | 0 refills | Status: DC

## 2017-01-02 NOTE — Patient Instructions (Addendum)
Let's get a urine today Try to drink 64 ounces of water a day, and you may flavor with lemon We'll contact you with the results Start the antibiotic Call me if symptoms persist  Try limit the following: High-oxalate foods to limit, if you eat them, are:  Spinach.  Bran flakes.  Rhubarb.  Beets.  Potato chips.  Jama[place] fries.  Nuts and nut butters.

## 2017-01-02 NOTE — Telephone Encounter (Signed)
Please call pt To follow-up on our visit today, please let her know that her leg heaviness could be from her back; spinal cord compression can cause symptoms like this; if she is not getting better quickly, contact us and we can either move up her ortho appt or order an MRI of her thoracic and lumbar spine Just wanted her to know

## 2017-01-02 NOTE — Assessment & Plan Note (Signed)
Going to see orthopaedist soon; may need MRI; willing to order if symptoms progress before her visit to ortho (if I could get that approved)

## 2017-01-02 NOTE — Progress Notes (Signed)
BP 112/64   Pulse (!) 103   Temp 97.9 F (36.6 C) (Oral)   Resp 16   Wt 127 lb 9.6 oz (57.9 kg)   SpO2 98%   BMI 21.23 kg/m    Subjective:    Patient ID: Eileen Novak, female    DOB: [birth year], 56 y.o.   MRN: [id number]  HPI: Eileen Novak is a 56 y.o. female  Chief Complaint  Patient presents with  . Extremity Weakness    Pt states she thinks it has to do with having crystal in her urine with the ordor. This began 3 weeks ago.     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 Had crystals in her urine; we reviewed that lab together today, as well as her other recent labs No hx of kidney stones No fam hx of kidney stones Urine has looked more yellow than usual; does have an odor She does have back pain (thoracic region) and has an appt to see the orthopaedist very soon Has a job in retail, stands No loss of control of bowels; has had a few accients with urine; urgency, couldn't make it tot he bathroom on time She woke up with sound like a bug in her right ear; got a q-tip; noise stopped; just wants me to check it  Depression screen [facility] 2/9 01/02/2017 12/07/2016 07/26/2016 01/03/2016 08/29/2015  Decreased Interest 0 0 0 0 1  Down, Depressed, Hopeless 0 0 0 0 1  PHQ - 2 Score 0 0 0 0 2  Altered sleeping - - - - 3  Tired, decreased energy - - - - 0  Change in appetite - - - - 0  Feeling bad or failure about yourself  - - - - 1  Trouble concentrating - - - - 0  Moving slowly or fidgety/restless - - - - 0  Suicidal thoughts - - - - 0  PHQ-9 Score - - - - 6  Difficult doing work/chores - - - - Not difficult at all    Relevant past medical, surgical, family and social history reviewed Past Medical History:  Diagnosis Date  . Allergy   . Anxiety   . Depression     . Shoulder pain   . Tobacco abuse 01/07/2016   Past Surgical History:  Procedure Laterality Date  . FACIAL FRACTURE SURGERY    . FOOT SURGERY     Family History  Problem Relation Age of Onset  . Cancer Mother        breast  . Depression Mother   . Heart disease Father   . Depression Brother   . ADD / ADHD Brother    Social History   Social History  . Marital status: Legally Separated    Spouse name: N/A  . Number of children: N/A  . Years of education: N/A   Occupational History  . Not on file.   Social History Main Topics  . Smoking status: Former Smoker    Packs/day: 1.00    Years: 40.00    Types: Cigarettes    Quit date: 05/28/2016  . Smokeless tobacco: Never Used  . Alcohol use 0.0 oz/week     Comment: occasionally  . Drug use: No  . Sexual activity: Yes  Other Topics Concern  . Not on file   Social History Narrative  . No narrative on file    Interim medical history since last visit reviewed. Allergies and medications reviewed  Review of Systems Per HPI unless specifically indicated above     Objective:    BP 112/64   Pulse (!) 103   Temp 97.9 F (36.6 C) (Oral)   Resp 16   Wt 127 lb 9.6 oz (57.9 kg)   SpO2 98%   BMI 21.23 kg/m   Wt Readings from Last 3 Encounters:  01/02/17 127 lb 9.6 oz (57.9 kg)  12/07/16 127 lb 4.8 oz (57.7 kg)  07/26/16 136 lb 11.2 oz (62 kg)    Physical Exam  Constitutional: She appears well-developed and well-nourished.  Pulmonary/Chest: Effort normal.  Neurological: She is alert.  Skin: No pallor.  Psychiatric: She has a normal mood and affect.    Results for orders placed or performed in visit on 12/13/16  TSH  Result Value Ref Range   TSH 0.01 (L) mIU/L  CBC with Differential/Platelet  Result Value Ref Range   WBC 6.9 3.8 - 10.8 K/uL   RBC 3.75 (L) 3.80 - 5.10 MIL/uL   Hemoglobin 11.5 (L) 11.7 - 15.5 g/dL   HCT 35.0 35.0 - 45.0 %   MCV 93.3 80.0 - 100.0 fL   MCH 30.7 27.0 - 33.0 pg   MCHC 32.9  32.0 - 36.0 g/dL   RDW 12.7 11.0 - 15.0 %   Platelets 291 140 - 400 K/uL   MPV 9.4 7.5 - 12.5 fL   Neutro Abs 3,726 1,500 - 7,800 cells/uL   Lymphs Abs 2,622 850 - 3,900 cells/uL   Monocytes Absolute 414 200 - 950 cells/uL   Eosinophils Absolute 138 15 - 500 cells/uL   Basophils Absolute 0 0 - 200 cells/uL   Neutrophils Relative % 54 %   Lymphocytes Relative 38 %   Monocytes Relative 6 %   Eosinophils Relative 2 %   Basophils Relative 0 %   Smear Review Criteria for review not met   T4, free  Result Value Ref Range   Free T4 1.9 (H) 0.8 - 1.8 ng/dL  COMPLETE METABOLIC PANEL WITH GFR  Result Value Ref Range   Sodium 143 135 - 146 mmol/L   Potassium 4.2 3.5 - 5.3 mmol/L   Chloride 108 98 - 110 mmol/L   CO2 25 20 - 32 mmol/L   Glucose, Bld 117 (H) 65 - 99 mg/dL   BUN 28 (H) 7 - 25 mg/dL   Creat 0.92 0.50 - 1.05 mg/dL   Total Bilirubin 0.2 0.2 - 1.2 mg/dL   Alkaline Phosphatase 114 33 - 130 U/L   AST 19 10 - 35 U/L   ALT 19 6 - 29 U/L   Total Protein 5.9 (L) 6.1 - 8.1 g/dL   Albumin 3.8 3.6 - 5.1 g/dL   Calcium 10.0 8.6 - 10.4 mg/dL   GFR, Est African American 81 >=60 mL/min   GFR, Est Non African American 70 >=60 mL/min      Assessment & Plan:   Problem List Items Addressed This Visit    None    Visit Diagnoses    Abnormal urine odor    -  Primary   check urine today; patient wishes to start antibiotics for possible UTI; reasonable given urgency, odor; will contact her with results   Relevant Orders   Urinalysis w microscopic + reflex cultur   Needs flu  shot       Relevant Orders   Flu Vaccine QUAD 6+ mos PF IM (Fluarix Quad PF) (Completed)   Calcium oxalate crystals present in urine       discussed risk of stone formation; hydrate; may use lemon in water; discussed foods to try to avoid   Relevant Orders   Urinalysis w microscopic + reflex cultur   Leg heaviness       spinal stenosis is in the ddx; she sees back doctor soon, may need MRI       Follow up  plan: No Follow-up on file.  An after-visit summary was printed and given to the patient at [place].  Please see the patient instructions which may contain other information and recommendations beyond what is mentioned above in the assessment and plan.  Meds ordered this encounter  Medications  . meloxicam (MOBIC) 15 MG tablet    Sig: Take 1 tablet by mouth daily.  . nitrofurantoin, macrocrystal-monohydrate, (MACROBID) 100 MG capsule    Sig: Take 1 capsule (100 mg total) by mouth 2 (two) times daily.    Dispense:  10 capsule    Refill:  0    Orders Placed This Encounter  Procedures  . Flu Vaccine QUAD 6+ mos PF IM (Fluarix Quad PF)  . Urinalysis w microscopic + reflex cultur

## 2017-01-03 NOTE — Telephone Encounter (Signed)
Spoke with pt and her appt with Dr. Sheppard PentonWolf is on September 13 and she is fine with that appt. She also states that she would like for him exam her first before doing any imaging. Also, Pt thinks she has caught a  Agricultural engineerBug ( patient own words) for the last week pt was experiencing  On and off dirrehea, nauseas, fatigue and feel like she have knots in her stomach.  Pt denies any other symptoms. She also states she don't know if it hs to do with the Macrobid. She states she was told she needs to drink more water so she is trying to do that; today she feels a little better . Please advise.

## 2017-01-03 NOTE — Telephone Encounter (Signed)
Pt.notified

## 2017-01-03 NOTE — Telephone Encounter (Signed)
I'll suggest she stop the Macrobid since her urine looked so good Try starting a probiotic once a day Let us know if not improving

## 2017-01-10 ENCOUNTER — Other Ambulatory Visit: Payer: Medicaid Other

## 2017-02-19 ENCOUNTER — Other Ambulatory Visit: Payer: Self-pay | Admitting: Physician Assistant

## 2017-02-19 DIAGNOSIS — M5412 Radiculopathy, cervical region: Secondary | ICD-10-CM

## 2017-02-27 ENCOUNTER — Ambulatory Visit
Admission: RE | Admit: 2017-02-27 | Discharge: 2017-02-27 | Disposition: A | Discharge status: Home / Self Care | Payer: Medicaid Other | Source: Ambulatory Visit | Attending: Physician Assistant | Admitting: Physician Assistant

## 2017-02-27 DIAGNOSIS — M4802 Spinal stenosis, cervical region: Secondary | ICD-10-CM | POA: Insufficient documentation

## 2017-02-27 DIAGNOSIS — M2578 Osteophyte, vertebrae: Secondary | ICD-10-CM | POA: Insufficient documentation

## 2017-02-27 DIAGNOSIS — M5412 Radiculopathy, cervical region: Secondary | ICD-10-CM

## 2017-02-27 DIAGNOSIS — M47812 Spondylosis without myelopathy or radiculopathy, cervical region: Secondary | ICD-10-CM | POA: Insufficient documentation

## 2017-05-09 ENCOUNTER — Ambulatory Visit: Payer: Self-pay | Admitting: Family Medicine

## 2017-05-10 ENCOUNTER — Ambulatory Visit: Payer: Medicaid Other | Admitting: Family Medicine

## 2017-05-10 ENCOUNTER — Encounter: Payer: Self-pay | Admitting: Family Medicine

## 2017-05-10 VITALS — BP 130/84 | HR 99 | Temp 98.2°F | Ht 65.0 in | Wt 134.3 lb

## 2017-05-10 DIAGNOSIS — M25562 Pain in left knee: Secondary | ICD-10-CM | POA: Diagnosis not present

## 2017-05-10 DIAGNOSIS — R252 Cramp and spasm: Secondary | ICD-10-CM

## 2017-05-10 DIAGNOSIS — M2550 Pain in unspecified joint: Secondary | ICD-10-CM

## 2017-05-10 DIAGNOSIS — M25561 Pain in right knee: Secondary | ICD-10-CM | POA: Diagnosis not present

## 2017-05-10 DIAGNOSIS — R110 Nausea: Secondary | ICD-10-CM | POA: Diagnosis not present

## 2017-05-10 DIAGNOSIS — M791 Myalgia, unspecified site: Secondary | ICD-10-CM | POA: Diagnosis not present

## 2017-05-10 DIAGNOSIS — G8929 Other chronic pain: Secondary | ICD-10-CM

## 2017-05-10 DIAGNOSIS — K2100 Gastro-esophageal reflux disease with esophagitis, without bleeding: Secondary | ICD-10-CM

## 2017-05-10 MED ORDER — OMEPRAZOLE 20 MG PO CPDR: 20.0000 mg | DELAYED_RELEASE_CAPSULE | ORAL | 1 refills | Status: DC | PRN

## 2017-05-10 NOTE — Patient Instructions (Signed)
Try turmeric as a natural anti-inflammatory (for pain and arthritis). It comes in capsules where you buy aspirin and fish oil, but also as a spice where you buy pepper and garlic powder. Try tonic water (diet is available) for the cramps Try to limit or avoid triggers like coffee, caffeinated beverages, onions, chocolate, spicy foods, peppermint, acidic foods like pizza, spaghetti sauce, and orange juice Lose weight if you are overweight or obese Try elevating the head of your bed by placing a small wedge between your mattress and box springs to keep acid in the stomach at night instead of coming up into your esophagus

## 2017-05-10 NOTE — Progress Notes (Signed)
BP 130/84 (BP Location: Left Arm, Patient Position: Sitting, Cuff Size: Normal)   Pulse 99   Temp 98.2 F (36.8 C) (Oral)   Ht 5\' 5"  (1.651 m)   Wt 134 lb 4.8 oz (60.9 kg)   SpO2 99%   BMI 22.35 kg/m    Subjective:    Patient ID: Eileen Novak, female    DOB: Apr 26, 1961, 57 y.o.   MRN: [id number]  HPI: Eileen Novak is a 57 y.o. female  Chief Complaint  Patient presents with  . Leg Pain    Bilateral   . Knee Pain    Left knee worse than right   . Cough  . Nausea    Pt states that she gets this feeling that she is going to vomit, happens when driving or lying in bed     HPI Patient is here for an acute visit, concerned about several things  She has been having a cough; tickle in her throat sometimes at work, just annoying; asked for something; just every once in a while; when she talks at work, sometimes coughs; has not been taking omeprazle  She is nauseated; comes and goes; can happen in a car, can be when sleeping; thick saliva coming up; not acid reflux; fighting with everything she has to not vomit; one time she was driving; still has gallbladder; not sure what makes it happens, none in several weeks; has the crystals; no blood in the stool  She has left knee pain > right knee pain; locked up twice; Dr. Artis Flock is her orthopaedist; getting worse over the years; crunches and left one actually has actually locked twice  Bilateral leg pain; hurt so much; she wanted to have her bladder checked for infection; hurting for 3 weeks; hurts to walk; like she has worked out with [employer]; pain right along the left shin; muscles that hurt; her aunt might have RA (paternal aunt); having some cramps in the calves, cramps in the feet, extend and draw; has been taking meloxicam; Dr. Orland Jarred  Chart review shows that she contacted her podiatrist in November asking for a muscle relaxer and he advised her to drink tonic water  Depression screen [facility] 2/9 05/10/2017 01/02/2017  12/07/2016 07/26/2016 01/03/2016  Decreased Interest 0 0 0 0 0  Down, Depressed, Hopeless 0 0 0 0 0  PHQ - 2 Score 0 0 0 0 0  Altered sleeping - - - - -  Tired, decreased energy - - - - -  Change in appetite - - - - -  Feeling bad or failure about yourself  - - - - -  Trouble concentrating - - - - -  Moving slowly or fidgety/restless - - - - -  Suicidal thoughts - - - - -  PHQ-9 Score - - - - -  Difficult doing work/chores - - - - -    Relevant past medical, surgical, family and social history reviewed Past Medical History:  Diagnosis Date  . Allergy   . Anxiety   . Depression   . Shoulder pain   . Tobacco abuse 01/07/2016   Past Surgical History:  Procedure Laterality Date  . FACIAL FRACTURE SURGERY    . FOOT SURGERY     Family History  Problem Relation Age of Onset  . Cancer Mother        breast  . Depression Mother   . Heart disease Father   . Depression Brother   . ADD / ADHD Brother  Social History   Tobacco Use  . Smoking status: Former Smoker    Packs/day: 1.00    Years: 40.00    Pack years: 40.00    Types: Cigarettes    Last attempt to quit: 05/28/2016    Years since quitting: 0.9  . Smokeless tobacco: Never Used  Substance Use Topics  . Alcohol use: Yes    Alcohol/week: 0.0 oz    Comment: occasionally  . Drug use: No    Interim medical history since last visit reviewed. Allergies and medications reviewed  Review of Systems Per HPI unless specifically indicated above     Objective:    BP 130/84 (BP Location: Left Arm, Patient Position: Sitting, Cuff Size: Normal)   Pulse 99   Temp 98.2 F (36.8 C) (Oral)   Ht 5\' 5"  (1.651 m)   Wt 134 lb 4.8 oz (60.9 kg)   SpO2 99%   BMI 22.35 kg/m   Wt Readings from Last 3 Encounters:  05/10/17 134 lb 4.8 oz (60.9 kg)  01/02/17 127 lb 9.6 oz (57.9 kg)  12/07/16 127 lb 4.8 oz (57.7 kg)    Physical Exam  Constitutional: She appears well-developed and well-nourished.  HENT:  Mouth/Throat: Mucous  membranes are normal.  Eyes: EOM are normal. No scleral icterus.  Cardiovascular: Normal rate and regular rhythm.  Pulmonary/Chest: Effort normal and breath sounds normal.  Musculoskeletal:  Crepitus both knees; antalgic gait  Skin: She is not diaphoretic.  Psychiatric: She has a normal mood and affect. Her behavior is normal. Her mood appears not anxious. She does not exhibit a depressed mood.      Assessment & Plan:   Problem List Items Addressed This Visit      Other   Knee pain, bilateral    Left worse than right; chronic; refer back to ortho      Relevant Orders   Ambulatory referral to Orthopedic Surgery    Other Visit Diagnoses    Leg cramping    -  Primary   Relevant Orders   Magnesium (Completed)   Myalgia       Relevant Orders   Magnesium (Completed)   CBC with Differential/Platelet (Completed)   ANA,IFA RA Diag Pnl w/rflx Tit/Patn (Completed)   Aldolase (Completed)   CK (Creatine Kinase) (Completed)   C-reactive protein (Completed)   Arthralgia, unspecified joint       Relevant Orders   CBC with Differential/Platelet (Completed)   Comprehensive metabolic panel (Completed)   ANA,IFA RA Diag Pnl w/rflx Tit/Patn (Completed)   CK (Creatine Kinase) (Completed)   C-reactive protein (Completed)   Ambulatory referral to Orthopedic Surgery   Nausea       Relevant Orders   Comprehensive metabolic panel (Completed)   H. pylori breath test (Completed)   Reflux esophagitis       Relevant Orders   H. pylori breath test (Completed)       Follow up plan: Return in about 3 weeks (around 05/31/2017) for follow-up visit with Dr. Sherie DonLada.  An after-visit summary was printed and given to the patient at check-out.  Please see the patient instructions which may contain other information and recommendations beyond what is mentioned above in the assessment and plan.  Meds ordered this encounter  Medications  . omeprazole (PRILOSEC) 20 MG capsule    Sig: Take 1 capsule (20 mg  total) by mouth as needed.    Dispense:  30 capsule    Refill:  1    Orders Placed This Encounter  Procedures  . Magnesium  . CBC with Differential/Platelet  . Comprehensive metabolic panel  . ANA,IFA RA Diag Pnl w/rflx Tit/Patn  . Aldolase  . CK (Creatine Kinase)  . C-reactive protein  . H. pylori breath test  . VITAMIN D 25 Hydroxy (Vit-D Deficiency, Fractures)  . Alkaline Phosphatase Isoenzymes  . TEST AUTHORIZATION  . Anti-nuclear ab-titer (ANA titer)  . Ambulatory referral to Orthopedic Surgery

## 2017-05-10 NOTE — Assessment & Plan Note (Signed)
Left worse than right; chronic; refer back to ortho

## 2017-05-13 ENCOUNTER — Telehealth: Payer: Self-pay | Admitting: Family Medicine

## 2017-05-13 ENCOUNTER — Other Ambulatory Visit: Payer: Self-pay | Admitting: Family Medicine

## 2017-05-13 DIAGNOSIS — R748 Abnormal levels of other serum enzymes: Secondary | ICD-10-CM | POA: Insufficient documentation

## 2017-05-13 DIAGNOSIS — M79673 Pain in unspecified foot: Secondary | ICD-10-CM | POA: Insufficient documentation

## 2017-05-13 LAB — H. PYLORI BREATH TEST: H. PYLORI BREATH TEST: NOT DETECTED

## 2017-05-13 NOTE — Telephone Encounter (Signed)
Copied from CRM [phone]. Topic: Quick Communication - See Telephone Encounter >> May 13, 2017 10:43 AM Oneal GroutSebastian, Jennifer S wrote: CRM for notification. See Telephone encounter for:  Patient thought that Dr Sherie DonLada was going to call her in some cough medicine, Medicap 05/13/17.

## 2017-05-13 NOTE — Telephone Encounter (Signed)
The Prilosec (omeprazole) should help I suspect she has some reflux Thank you

## 2017-05-13 NOTE — Telephone Encounter (Signed)
Copied from CRM [phone]. Topic: Quick Communication - See Telephone Encounter >> May 13, 2017  8:37 AM Herby AbrahamJohnson, Shiquita C wrote:    CRM for notification. See Telephone encounter for: pt called in to be advised. Pt says that she was seen on Friday and had blood work drawn.  Pt says that provider is checking for Lupus but she would also would like to be checked for Raynaud disease. Pt would like to know if she should be referred to a podiatrist (Dr. Orland Jarredroxler) because over the weekend pt says that her foot was in a lot of pain. Pt says that her veins on her feet were bulging out. Pt says that she took pictures for provider if needed.    Please advise.  05/13/17.

## 2017-05-13 NOTE — Assessment & Plan Note (Signed)
Check isoenzymes, refer to rheum

## 2017-05-13 NOTE — Telephone Encounter (Signed)
Called pt informed her of Dr.Lada's message below. Pt gave verbal understanding.  

## 2017-05-13 NOTE — Telephone Encounter (Signed)
Referral entered for Dr. Latrelle Dodrillroxler We'll see what the labs show We can refer her to a rheumatologist for the concern about raynaud's, but her CK is elevated, suggestive of muscle breakdown or disease; we'll have her see rheum soon

## 2017-05-13 NOTE — Telephone Encounter (Signed)
Pt.notified

## 2017-05-13 NOTE — Assessment & Plan Note (Signed)
Refer to rheum 

## 2017-05-13 NOTE — Telephone Encounter (Signed)
Please see message below and advise . Thanks

## 2017-05-13 NOTE — Progress Notes (Signed)
Add on Vit D and alk phos isoenzymes

## 2017-05-13 NOTE — Assessment & Plan Note (Signed)
Refer to Dr. Orland Jarredroxler

## 2017-05-15 ENCOUNTER — Telehealth: Payer: Self-pay | Admitting: Family Medicine

## 2017-05-15 LAB — COMPREHENSIVE METABOLIC PANEL
AG RATIO: 1.8 (calc) (ref 1.0–2.5)
ALBUMIN MSPROF: 4.4 g/dL (ref 3.6–5.1)
ALT: 13 U/L (ref 6–29)
AST: 19 U/L (ref 10–35)
Alkaline phosphatase (APISO): 250 U/L — ABNORMAL HIGH (ref 33–130)
BUN / CREAT RATIO: 28 (calc) — AB (ref 6–22)
BUN: 29 mg/dL — ABNORMAL HIGH (ref 7–25)
CO2: 24 mmol/L (ref 20–32)
CREATININE: 1.04 mg/dL (ref 0.50–1.05)
Calcium: 9.3 mg/dL (ref 8.6–10.4)
Chloride: 109 mmol/L (ref 98–110)
GLOBULIN: 2.4 g/dL (ref 1.9–3.7)
GLUCOSE: 72 mg/dL (ref 65–99)
POTASSIUM: 4.2 mmol/L (ref 3.5–5.3)
Sodium: 141 mmol/L (ref 135–146)
Total Bilirubin: 0.1 mg/dL — ABNORMAL LOW (ref 0.2–1.2)
Total Protein: 6.8 g/dL (ref 6.1–8.1)

## 2017-05-15 LAB — ANA,IFA RA DIAG PNL W/RFLX TIT/PATN
Anti Nuclear Antibody(ANA): POSITIVE — AB
Cyclic Citrullin Peptide Ab: <16 UNITS
Rhuematoid fact SerPl-aCnc: <14 IU/mL (ref <14)

## 2017-05-15 LAB — ANTI-NUCLEAR AB-TITER (ANA TITER)

## 2017-05-15 LAB — CBC WITH DIFFERENTIAL/PLATELET
Basophils Absolute: 28 cells/uL (ref 0–200)
Basophils Relative: 0.3 %
Eosinophils Absolute: 177 cells/uL (ref 15–500)
Eosinophils Relative: 1.9 %
HEMATOCRIT: 36.9 % (ref 35.0–45.0)
Hemoglobin: 12.5 g/dL (ref 11.7–15.5)
LYMPHS ABS: 3943 {cells}/uL — AB (ref 850–3900)
MCH: 30.9 pg (ref 27.0–33.0)
MCHC: 33.9 g/dL (ref 32.0–36.0)
MCV: 91.1 fL (ref 80.0–100.0)
MPV: 9.9 fL (ref 7.5–12.5)
Monocytes Relative: 5.1 %
NEUTROS PCT: 50.3 %
Neutro Abs: 4678 cells/uL (ref 1500–7800)
Platelets: 320 10*3/uL (ref 140–400)
RBC: 4.05 10*6/uL (ref 3.80–5.10)
RDW: 12.7 % (ref 11.0–15.0)
Total Lymphocyte: 42.4 %
WBC: 9.3 10*3/uL (ref 3.8–10.8)
WBCMIX: 474 {cells}/uL (ref 200–950)

## 2017-05-15 LAB — MAGNESIUM: MAGNESIUM: 2.1 mg/dL (ref 1.5–2.5)

## 2017-05-15 LAB — TEST AUTHORIZATION

## 2017-05-15 LAB — ALKALINE PHOSPHATASE ISOENZYMES
Alkaline phosphatase (APISO): 251 U/L — ABNORMAL HIGH (ref 33–130)
Bone Isoenzymes: 79 % — ABNORMAL HIGH (ref 28–66)
INTESTINAL ISOENZYMES (ALP ISO): 7 % (ref 1–24)
Liver Isoenzymes: 14 % — ABNORMAL LOW (ref 25–69)

## 2017-05-15 LAB — CK: Total CK: 273 U/L — ABNORMAL HIGH (ref 29–143)

## 2017-05-15 LAB — VITAMIN D 25 HYDROXY (VIT D DEFICIENCY, FRACTURES): Vit D, 25-Hydroxy: 27 ng/mL — ABNORMAL LOW (ref 30–100)

## 2017-05-15 LAB — C-REACTIVE PROTEIN: CRP: 1.9 mg/L (ref <8.0)

## 2017-05-15 LAB — ALDOLASE: Aldolase: 4.7 U/L (ref <=8.1)

## 2017-05-15 NOTE — Telephone Encounter (Signed)
Eileen Novak, I may be missing it, but I do not see alkaline phosphatase isoenzymes I need the break down of bone, intestine, liver and only see total Please check with lab

## 2017-05-15 NOTE — Telephone Encounter (Signed)
Check again I see 251 there is 2 listed

## 2017-05-15 NOTE — Telephone Encounter (Signed)
Gave to lab they are checking status

## 2017-05-15 NOTE — Telephone Encounter (Signed)
Yes, I see 250 and 251, but I need the isoenzymes, the breakdown of how much of that 251 is from bone, how much is from liver, and how much is from intestine (those will be three other number in percentages) Please check with the lab Thank you

## 2017-05-16 ENCOUNTER — Telehealth: Payer: Self-pay | Admitting: Family Medicine

## 2017-05-16 MED ORDER — HYDROCODONE-ACETAMINOPHEN 5-325 MG PO TABS: 1.0000 | ORAL_TABLET | Freq: Four times a day (QID) | ORAL | 0 refills | Status: DC | PRN

## 2017-05-16 NOTE — Telephone Encounter (Signed)
appt is Jan 22nd The meloxicam is not helping pain at all Pain level is an 8 out of 10 when it's really bad She says the pain seemed to go back down into the left foot; then the next day it was back up in the legs Felt like ankle was going to break in two when it seemed all concentrated Now in the legs Upper extremities are relatively okay Muscle pain, like she has been working out like crazy She sees Dr. Tresa MooreBehalal-Bock I will call her tomorrow and see if she agrees with steroids She was on something that made her itch all over She is slowly gaining after thyroid treatment; radiation pill; the treatment was in September, and they are still checking her numbers; goes back on the 30th for more labs Spot on the skin of the shin that aches, nothing visible, but she can touch it and it's tender Will give her hydrocodone until Dr. Renard MatterBock can help us figure this out

## 2017-05-17 MED ORDER — PREDNISONE 10 MG PO TABS: ORAL_TABLET | ORAL | 0 refills | Status: AC

## 2017-05-17 NOTE — Telephone Encounter (Signed)
Pt.notified

## 2017-05-17 NOTE — Telephone Encounter (Signed)
[facility]; no providers available, Dr. Renard MatterBock not there; Dr. [facility] not working on Fridays any more; left message asking for Dr. Tresa MooreBehalal-Bock to call me about treatment recs until patient can get in; please review labs She [facility]. [facility] usually says that it okay to start prednisone if in pain and just start, don't worry about waiting until seen I thanked her for checking and will start prednisone; message left therefore for Dr. Leilani AbleB-B to call me if any questions or concerns about doing the prednisone ------------------------------------ Asher MuirJamie, please let patient know that we'll start a prednisone taper to see if that helps before she gets in to see the rheumatologist She may continue the hydrocodone/apap if needed Do NOT take any ibuprofen or Aleve or Motrin or Advil while on the prednisone Always take it with food Thank you

## 2017-05-28 DIAGNOSIS — R269 Unspecified abnormalities of gait and mobility: Secondary | ICD-10-CM | POA: Insufficient documentation

## 2017-05-28 DIAGNOSIS — E079 Disorder of thyroid, unspecified: Secondary | ICD-10-CM | POA: Insufficient documentation

## 2017-05-28 DIAGNOSIS — M5030 Other cervical disc degeneration, unspecified cervical region: Secondary | ICD-10-CM | POA: Insufficient documentation

## 2017-05-31 ENCOUNTER — Telehealth: Payer: Self-pay | Admitting: Family Medicine

## 2017-05-31 NOTE — Telephone Encounter (Signed)
I see that patient had abnormal thyroid tests, and rheumatologist was going to fax those results to patient's endocrinologist I was not aware that patient had an endocrinologist Please call patient, get more information, what is the issue with her thyroid and who is she seeing for that Thank you

## 2017-05-31 NOTE — Telephone Encounter (Signed)
Left detailed voicemail

## 2017-06-03 NOTE — Telephone Encounter (Signed)
Called patient back, she states you referred her for hypothyroidism.  I confirmed it is under referrals.

## 2017-06-03 NOTE — Telephone Encounter (Signed)
Ah, thank you. His notes are under media, which is why I wasn't seeing them Thank you

## 2017-06-24 NOTE — Progress Notes (Signed)
Closing out scanned document note open since  2017 

## 2017-06-24 NOTE — Progress Notes (Signed)
Closing out lab/order note open since:  Aug 2018 

## 2017-06-24 NOTE — Progress Notes (Signed)
Closing out lab/order note open since:  Jan 2019 

## 2017-06-25 ENCOUNTER — Telehealth: Payer: Self-pay | Admitting: Family Medicine

## 2017-06-25 DIAGNOSIS — Z7689 Persons encountering health services in other specified circumstances: Secondary | ICD-10-CM

## 2017-06-25 DIAGNOSIS — Z1231 Encounter for screening mammogram for malignant neoplasm of breast: Secondary | ICD-10-CM

## 2017-06-25 NOTE — Telephone Encounter (Signed)
Yes, please. Thank you.

## 2017-06-25 NOTE — Telephone Encounter (Signed)
Copied from CRM [phone]. Topic: General - Other >> Jun 25, 2017 10:02 AM Lelon FrohlichGolden, Tashia, RMA wrote: Reason for CRM: pt would like a referral for a mammogram and [facility] and pt would like both to be scheduled before the end of the month due to insurance ending and not before 11 a.m. Please contact pt if you have any further questions at [phone] can leave vm if needed   Ok to place referrals? Please advise. Thanks

## 2017-06-25 NOTE — Telephone Encounter (Signed)
Called pt LM for her informing her that referrals have been placed, advised pt that she should call [facility] in order to schedule mammo.

## 2017-06-25 NOTE — Telephone Encounter (Signed)
Copied from CRM [phone]. Topic: General - Other >> Jun 25, 2017 10:02 AM Lelon FrohlichGolden, Jaiyana Canale, RMA wrote: Reason for CRM: pt would like a referral for a mammogram and a [facility] and pt would like both to be scheduled before the end of the month due to insurance ending and not before 11 a.m. Please contact pt if you have any further questions at [phone] can leave vm if needed

## 2017-06-26 ENCOUNTER — Ambulatory Visit
Admission: RE | Admit: 2017-06-26 | Discharge: 2017-06-26 | Disposition: A | Discharge status: Home / Self Care | Payer: Medicaid Other | Source: Ambulatory Visit | Attending: Family Medicine | Admitting: Family Medicine

## 2017-06-26 ENCOUNTER — Other Ambulatory Visit: Payer: Self-pay | Admitting: Family Medicine

## 2017-06-26 DIAGNOSIS — Z1231 Encounter for screening mammogram for malignant neoplasm of breast: Secondary | ICD-10-CM

## 2017-06-27 ENCOUNTER — Encounter: Payer: Self-pay | Admitting: Family Medicine

## 2017-06-28 ENCOUNTER — Telehealth: Payer: Self-pay | Admitting: Obstetrics & Gynecology

## 2017-06-28 NOTE — Telephone Encounter (Signed)
[facility] is referring to establish care. Called and left voicemail for patient to call back to be schedule

## 2017-07-02 ENCOUNTER — Encounter: Payer: Self-pay | Admitting: Certified Nurse Midwife

## 2017-07-02 ENCOUNTER — Ambulatory Visit: Payer: Medicaid Other | Admitting: Certified Nurse Midwife

## 2017-07-02 VITALS — BP 102/58 | HR 91 | Ht 66.0 in | Wt 136.0 lb

## 2017-07-02 DIAGNOSIS — N898 Other specified noninflammatory disorders of vagina: Secondary | ICD-10-CM

## 2017-07-02 DIAGNOSIS — N952 Postmenopausal atrophic vaginitis: Secondary | ICD-10-CM | POA: Diagnosis not present

## 2017-07-02 DIAGNOSIS — Z113 Encounter for screening for infections with a predominantly sexual mode of transmission: Secondary | ICD-10-CM | POA: Diagnosis not present

## 2017-07-02 LAB — POCT WET PREP (WET MOUNT): Trichomonas Wet Prep HPF POC: ABSENT

## 2017-07-02 NOTE — Progress Notes (Signed)
Obstetrics & Gynecology Office Visit   Chief Complaint:  Chief Complaint  Patient presents with  . Referral    vaginal discharge    History of Present Illness: 57 year old G3 25P2012 postmenopausal female who presents with a complaint of vaginal discharge x 2-3 weeks. The discharge is yellow, has an odor, but she denies any vulvovaginal itching or irritation. Has had a problem with vaginal discharge and pain with intercourse. Is interested in Premarin cream. Also complains of decreased libido, does not have sex often.  In a 10 year off and on relationship with a female partner. Denies any recent antibiotic usage. Has a history of anxiety which is worsened by her health problems (hyperthyroidism, I131 ablation with complications-affected her gait) and her 56 year old son's PTSD.   [facility] and it was NIL/negative. 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  Mammogram last week was negative.   Review of Systems:  Review of Systems  Constitutional: Negative for chills, fever and weight loss.  HENT: Negative for congestion, sinus pain and sore throat.   Eyes: Negative for blurred vision and pain.  Respiratory: Negative for hemoptysis, shortness of breath and wheezing.   Cardiovascular: Negative for chest pain, palpitations and leg swelling.  Gastrointestinal: Negative for abdominal pain, blood in stool, diarrhea, heartburn, nausea and vomiting.  Genitourinary: Negative for dysuria, frequency, hematuria and urgency.       Positive for vaginal discharge and pain with intercourse  Musculoskeletal: Positive for joint pain (and swelling). Negative for back pain and myalgias.       Positive for muscle weakness  Skin: Negative for itching and rash.  Neurological: Positive for headaches. Negative for dizziness and tingling.  Endo/Heme/Allergies: Negative for environmental allergies and polydipsia. Does not bruise/bleed easily.       Negative for  hirsutism   Psychiatric/Behavioral: Negative for depression. The patient is nervous/anxious. The patient does not have insomnia.      Past Medical History:  Past Medical History:  Diagnosis Date  . Allergy   . Anxiety   . Depression   . Knee pain, chronic   . Shoulder pain   . Thyroid disease   . Tobacco abuse 01/07/2016    Past Surgical History:  Past Surgical History:  Procedure Laterality Date  . AUGMENTATION MAMMAPLASTY Bilateral 1997  . CESAREAN SECTION    . FACIAL FRACTURE SURGERY    . FOOT SURGERY      Gynecologic History: No LMP recorded. Patient is postmenopausal.  Obstetric History: [id number]  Family History:  Family History  Problem Relation Age of Onset  . Depression Mother   . Breast cancer Mother 5070  . Heart disease Father   . Depression Brother   . ADD / ADHD Brother     Social History:  Social History   Socioeconomic History  . Marital status: Divorced    Spouse name: Not on file  . Number of children: Not on file  . Years of education: Not on file  . Highest education level: Not on file  Social Needs  . Financial resource strain: Not on file  . Food insecurity - worry: Not on file  . Food insecurity - inability: Not on file  . Transportation needs - medical: Not on file  . Transportation needs - non-medical: Not on file  Occupational History  . Not on file  Tobacco Use  . Smoking status: Former Smoker    Packs/day: 1.00  Years: 40.00    Pack years: 40.00    Types: Cigarettes    Last attempt to quit: 05/28/2016    Years since quitting: 1.0  . Smokeless tobacco: Never Used  Substance and Sexual Activity  . Alcohol use: Yes    Alcohol/week: 0.0 oz    Comment: occasionally  . Drug use: No  . Sexual activity: Yes    Partners: Male    Birth control/protection: Post-menopausal  Other Topics Concern  . Not on file  Social History Narrative  . Not on file    Allergies:  Allergies  Allergen Reactions  . Escitalopram Oxalate      REACTION: nausea, decreased libito  . Penicillins     REACTION: as child    Medications: Prior to Admission medications   Medication Sig Start Date End Date Taking? Authorizing Provider  calcium carbonate (TUMS - DOSED IN MG ELEMENTAL CALCIUM) 500 MG chewable tablet Chew 1 tablet by mouth as needed for indigestion or heartburn.   Yes [provider]  levothyroxine (SYNTHROID, LEVOTHROID) 112 MCG tablet Take 112 mcg by mouth daily before breakfast.   Yes [provider]  meloxicam (MOBIC) 7.5 MG tablet Take 7.5 mg by mouth daily.   Yes [provider]  omeprazole (PRILOSEC) 20 MG capsule Take 1 capsule (20 mg total) by mouth as needed. 05/10/17  Yes Lada, Janit Bern, MD  predniSONE (STERAPRED UNI-PAK 48 TAB) 10 MG (48) TBPK tablet See admin instructions. 06/20/17  Yes [provider]    Physical Exam Vitals: BP (!) 102/58   Pulse 91   Ht 5\' 6"  (1.676 m)   Wt 136 lb (61.7 kg)   BMI 21.95 kg/m  Physical Exam  Constitutional: She is oriented to person, place, and time. She appears well-developed and well-nourished.  Anxoius, near tears at time  GI: Soft. There is no tenderness.  Genitourinary:  Genitourinary Comments: Vulva: no inflammation, swelling or lesions Vagina: off white-yellow thick discharge Cervix: no lesions, non friable Uterus: NSSC, mobile, NT Adnexa: no masses, NT  Neurological: She is alert and oriented to person, place, and time.  Skin: Skin is warm and dry.  Psychiatric: Her speech is normal and behavior is normal. Her mood appears anxious.   Wet prep done Results for orders placed or performed in visit on 07/02/17 (from the past 24 hour(s))  POCT Wet Prep Mellody Drown [place])     Status: Abnormal   Collection Time: 07/02/17  4:33 PM  Result Value Ref Range   Source Wet Prep POC vaginal    WBC, Wet Prep HPF POC TNTC    Bacteria Wet Prep HPF POC Many (A) Few   BACTERIA WET PREP MORPHOLOGY POC     Clue Cells Wet Prep HPF POC Few (A) None    Clue Cells Wet Prep Whiff POC     Yeast Wet Prep HPF POC None    KOH Wet Prep POC     Trichomonas Wet Prep HPF POC Absent Absent    Assessment: 57 y.o. [id number] with atrophic vaginitis/ bacterial vaginosis (TNTC WBC's obscuring few squamous cells present,  with many bacteria) R/O STD  Plan: GC/Chlamydia/Trich NAAT RX for Solosec with instructions on how to take, but if too expensive, given a different RX for Tindamax 1 GM x 5 days (explained not to drink alcohol on this med and to take with food. Sample of Premarin cream given with instructions to start after completing tx for vaginitis. Recommend 0.5 GM 3x/week at first then can  decrease to twice weekly if working well. RTO prn (losing medical insurance?)  Farrel Conners, [place]

## 2017-07-03 ENCOUNTER — Other Ambulatory Visit: Payer: Self-pay

## 2017-07-03 DIAGNOSIS — B9689 Other specified bacterial agents as the cause of diseases classified elsewhere: Secondary | ICD-10-CM

## 2017-07-03 DIAGNOSIS — N760 Acute vaginitis: Secondary | ICD-10-CM

## 2017-07-03 MED ORDER — METRONIDAZOLE 500 MG PO TABS: 500.0000 mg | ORAL_TABLET | Freq: Two times a day (BID) | ORAL | 0 refills | Status: AC

## 2017-07-03 NOTE — Telephone Encounter (Signed)
Pt called to notify that solosec not available at pharmacy and tindamax requires prior auth. Pt is concerned due to medical insurance ends tomorrow.   Sent in flagyl 500 mg BID x 7 days per Farrel Connersolleen Gutierrez.   Left msg for pt that rx sent in.

## 2017-07-10 ENCOUNTER — Telehealth: Payer: Self-pay | Admitting: Certified Nurse Midwife

## 2017-07-10 DIAGNOSIS — Z113 Encounter for screening for infections with a predominantly sexual mode of transmission: Secondary | ICD-10-CM

## 2017-07-10 LAB — CHLAMYDIA/GONOCOCCUS/TRICHOMONAS, NAA: Trich vag by NAA: NEGATIVE

## 2017-07-10 NOTE — Telephone Encounter (Signed)
The lab was unable to process vaginal swab for GC and Chlamydia due to an interfering substance. PAtient can come by tomorrow and will give us a random urine for GC/Chlamydia. Farrel Connersolleen Tamekia Rotter, CNM

## 2017-07-11 ENCOUNTER — Telehealth: Payer: Self-pay | Admitting: Certified Nurse Midwife

## 2017-07-11 NOTE — Telephone Encounter (Signed)
Patient is calling to let CLG no her insurance has changed and has received the new card. That she would not be able to come in to do an urine culture today. As CLG advised

## 2017-07-11 NOTE — Telephone Encounter (Signed)
Pt also left msg on triage with this information. She has NOT received her new card and plans to come in once it is received.

## 2017-09-03 ENCOUNTER — Telehealth: Payer: Self-pay

## 2017-09-03 DIAGNOSIS — E079 Disorder of thyroid, unspecified: Secondary | ICD-10-CM

## 2017-09-03 NOTE — Telephone Encounter (Signed)
Copied from CRM [id number]. Topic: Referral - Request >> Sep 03, 2017 10:15 AM Arlyss Gandy, NT wrote: Reason for CRM: Patient requesting a referral to the endocrinologist at [facility] for her thyroid for her insurance to cover.

## 2017-09-08 NOTE — Addendum Note (Signed)
Addended by: LADA, Janit Bern on: 09/08/2017 10:43 AM   Modules accepted: Orders

## 2017-09-08 NOTE — Telephone Encounter (Signed)
Note was closed when I received it Addendum generated Referral entered Signing off

## 2018-01-28 ENCOUNTER — Ambulatory Visit: Payer: BLUE CROSS/BLUE SHIELD | Admitting: Family Medicine

## 2018-01-28 ENCOUNTER — Encounter: Payer: Self-pay | Admitting: Family Medicine

## 2018-01-28 VITALS — BP 120/84 | HR 93 | Temp 98.3°F | Resp 14 | Ht 66.0 in | Wt 139.3 lb

## 2018-01-28 DIAGNOSIS — R350 Frequency of micturition: Secondary | ICD-10-CM | POA: Diagnosis not present

## 2018-01-28 DIAGNOSIS — B373 Candidiasis of vulva and vagina: Secondary | ICD-10-CM | POA: Diagnosis not present

## 2018-01-28 DIAGNOSIS — B3731 Acute candidiasis of vulva and vagina: Secondary | ICD-10-CM

## 2018-01-28 DIAGNOSIS — N3001 Acute cystitis with hematuria: Secondary | ICD-10-CM

## 2018-01-28 LAB — POCT URINALYSIS DIPSTICK
Bilirubin, UA: NEGATIVE
Glucose, UA: NEGATIVE
KETONES UA: NEGATIVE
NITRITE UA: NEGATIVE
PH UA: 5 (ref 5.0–8.0)
PROTEIN UA: NEGATIVE
Spec Grav, UA: 1.025 (ref 1.010–1.025)
UROBILINOGEN UA: NEGATIVE U/dL — AB

## 2018-01-28 MED ORDER — FLUCONAZOLE 150 MG PO TABS: 150.0000 mg | ORAL_TABLET | Freq: Once | ORAL | 0 refills | Status: AC

## 2018-01-28 MED ORDER — SULFAMETHOXAZOLE-TRIMETHOPRIM 800-160 MG PO TABS: 1.0000 | ORAL_TABLET | Freq: Two times a day (BID) | ORAL | 0 refills | Status: AC

## 2018-01-28 NOTE — Patient Instructions (Signed)

## 2018-01-28 NOTE — Progress Notes (Signed)
Name: Eileen Novak   MRN: [id number]    DOB: [birth year]   Date:01/28/2018       Progress Note  Subjective  Chief Complaint  Chief Complaint  Patient presents with  . Urinary Frequency    for 3 days    HPI  PT presents with concern for urinary frequency for about 3 days - she is urinating frequently, but only urinating a small amount at a time.  Denies history of kidney stones, no dysuria, fevers/chills, occasional ongoing abdominal cramping - baseline for her.  No frank hematuria, no flank pain.  Patient Active Problem List   Diagnosis Date Noted  . DDD (degenerative disc disease), cervical 05/28/2017  . Gait disturbance 05/28/2017  . Thyroid disease 05/28/2017  . Foot pain 05/13/2017  . Elevated CK 05/13/2017  . Elevated alkaline phosphatase level 05/13/2017  . Knee pain, bilateral 05/10/2017  . Medication monitoring encounter 12/13/2016  . Tachycardia 12/07/2016  . Proximal humerus fracture 08/18/2016  . Chest pain 01/07/2016  . Hx of smoking 01/07/2016  . 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 10/27/2015  . Constipation 09/06/2015  . Anxiety disorder 08/29/2015  . Pain of right foot 08/29/2015  . Chronic left shoulder pain 08/29/2015  . Neck pain, chronic 08/29/2015  . Bilateral thoracic back pain 08/29/2015  . DEPRESSION 12/03/2006  . GERD 12/03/2006    Social History   Tobacco Use  . Smoking status: Former Smoker    Packs/day: 1.00    Years: 40.00    Pack years: 40.00    Types: Cigarettes    Last attempt to quit: 05/28/2016    Years since quitting: 1.6  . Smokeless tobacco: Never Used  Substance Use Topics  . Alcohol use: Yes    Alcohol/week: 0.0 standard drinks    Comment: occasionally     Current Outpatient Medications:  .  calcium carbonate (TUMS - DOSED IN MG ELEMENTAL CALCIUM) 500 MG chewable tablet, Chew 1 tablet by mouth as needed for indigestion or heartburn., Disp: , Rfl:  .  fluconazole (DIFLUCAN) 150 MG tablet, Take 1 tablet (150 mg total) by mouth  once for 1 dose., Disp: 1 tablet, Rfl: 0 .  levothyroxine (SYNTHROID, LEVOTHROID) 112 MCG tablet, Take 112 mcg by mouth daily before breakfast., Disp: , Rfl:  .  meloxicam (MOBIC) 7.5 MG tablet, Take 7.5 mg by mouth daily., Disp: , Rfl:  .  omeprazole (PRILOSEC) 20 MG capsule, Take 1 capsule (20 mg total) by mouth as needed., Disp: 30 capsule, Rfl: 1 .  predniSONE (STERAPRED UNI-PAK 48 TAB) 10 MG (48) TBPK tablet, See admin instructions., Disp: , Rfl: 0 .  sulfamethoxazole-trimethoprim (BACTRIM DS,SEPTRA DS) 800-160 MG tablet, Take 1 tablet by mouth 2 (two) times daily for 3 days., Disp: 6 tablet, Rfl: 0  Allergies  Allergen Reactions  . Escitalopram Oxalate     REACTION: nausea, decreased libito  . Penicillins     REACTION: as child    I personally reviewed active problem list, medication list, allergies, lab results with the patient/caregiver today.  ROS  Ten systems reviewed and is negative except as mentioned in HPI.  Objective  Vitals:   01/28/18 1537  BP: 120/84  Pulse: 93  Resp: 14  Temp: 98.3 F (36.8 C)  TempSrc: Oral  SpO2: 99%  Weight: 139 lb 4.8 oz (63.2 kg)  Height: 5\' 6"  (1.676 m)   Body mass index is 22.48 kg/m.  Nursing Note and Vital Signs reviewed.  Physical Exam  Constitutional: Patient appears well-developed and  well-nourished. No distress.  HENT: Head: Normocephalic and atraumatic.  Cardiovascular: Normal rate, regular rhythm and normal heart sounds.  No murmur heard. No BLE edema. Pulmonary/Chest: Effort normal and breath sounds normal. No respiratory distress. Abdominal: Soft. Bowel sounds are normal, no distension. There is no tenderness. No masses. No CVA tenderness Musculoskeletal: Normal range of motion, no joint effusions. No gross deformities Neurological: Pt is alert and oriented to person, place, and time. No cranial nerve deficit. Coordination, balance, strength, speech and gait are normal.  Skin: Skin is warm and dry. No rash noted. No  erythema.  Psychiatric: Patient has a normal mood and affect. behavior is normal. Judgment and thought content normal.  Results for orders placed or performed in visit on 01/28/18 (from the past 72 hour(s))  POCT urinalysis dipstick     Status: Abnormal   Collection Time: 01/28/18  4:20 PM  Result Value Ref Range   Color, UA yellow    Clarity, UA clear    Glucose, UA Negative Negative   Bilirubin, UA negative    Ketones, UA negative    Spec Grav, UA 1.025 1.010 - 1.025   Blood, UA trace    pH, UA 5.0 5.0 - 8.0   Protein, UA Negative Negative   Urobilinogen, UA negative (A) 0.2 or 1.0 E.U./dL   Nitrite, UA negative    Leukocytes, UA Trace (A) Negative   Appearance clear    Odor none    Assessment & Plan  1. Acute cystitis with hematuria - Advised we will treat for cystitis, if not improving, return for further evaluation.  Follow up in 2 weeks for nurse visit to ensure hematuria has resolved.  If not resolved, will refer to urology - sulfamethoxazole-trimethoprim (BACTRIM DS,SEPTRA DS) 800-160 MG tablet; Take 1 tablet by mouth 2 (two) times daily for 3 days.  Dispense: 6 tablet; Refill: 0  2. Frequent urination - POCT urinalysis dipstick - Urine Culture  3. Vaginal candidiasis - She notes she is very prone to vaginal yeast infections with antibiotic use, we will provide diflucan today. - fluconazole (DIFLUCAN) 150 MG tablet; Take 1 tablet (150 mg total) by mouth once for 1 dose.  Dispense: 1 tablet; Refill: 0

## 2018-01-29 LAB — URINE CULTURE
MICRO NUMBER: 91147597
SPECIMEN QUALITY:: ADEQUATE

## 2018-02-12 ENCOUNTER — Ambulatory Visit (INDEPENDENT_AMBULATORY_CARE_PROVIDER_SITE_OTHER): Payer: BLUE CROSS/BLUE SHIELD | Admitting: Emergency Medicine

## 2018-02-12 DIAGNOSIS — N3000 Acute cystitis without hematuria: Secondary | ICD-10-CM | POA: Diagnosis not present

## 2018-02-12 LAB — POCT URINALYSIS DIPSTICK
Bilirubin, UA: NEGATIVE
Blood, UA: NEGATIVE
GLUCOSE UA: NEGATIVE
Ketones, UA: NEGATIVE
LEUKOCYTES UA: NEGATIVE
NITRITE UA: NEGATIVE
PROTEIN UA: NEGATIVE
Spec Grav, UA: 1.015 (ref 1.010–1.025)
Urobilinogen, UA: NEGATIVE E.U./dL — AB
pH, UA: 5 (ref 5.0–8.0)

## 2018-03-13 IMAGING — MG MM DIGITAL SCREENING IMPLANTS W/ CAD
8 series · 8 of 8 positions shown · non-contrast
Comparison: Previous exam(s).

CLINICAL DATA: Screening.

EXAM:
DIGITAL SCREENING BILATERAL MAMMOGRAM WITH IMPLANTS AND CAD
The patient has retroglandular implants. Standard and implant
displaced views were performed.

[L MLO (1 of 2)]
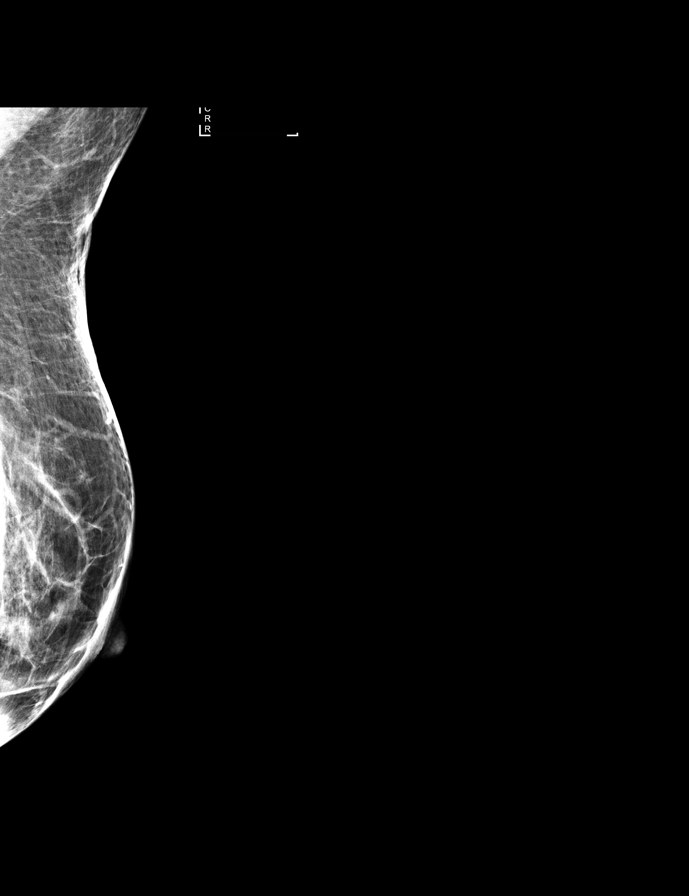

[R CC (1 of 2)]
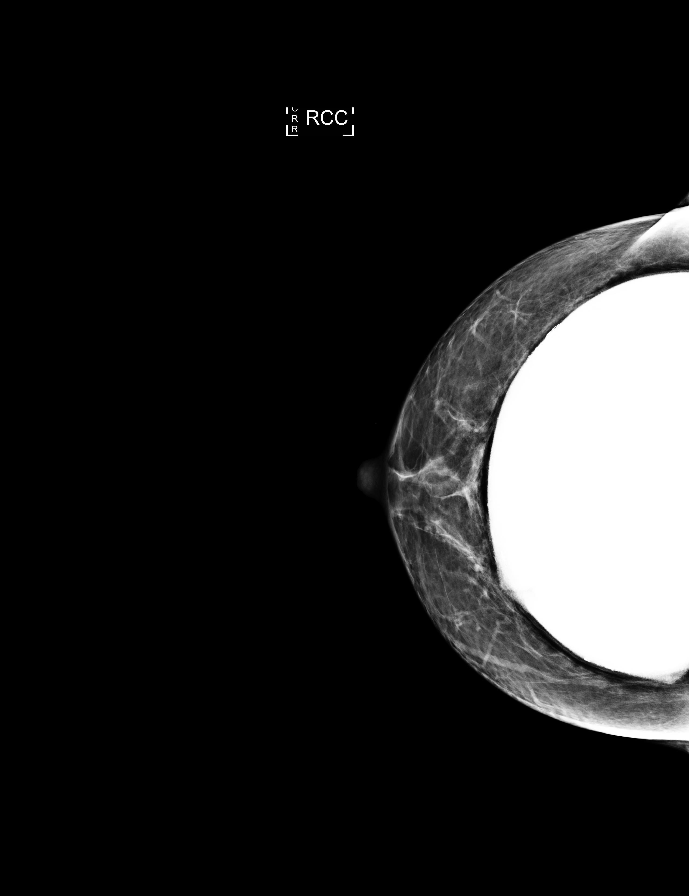

[R CC (2 of 2)]
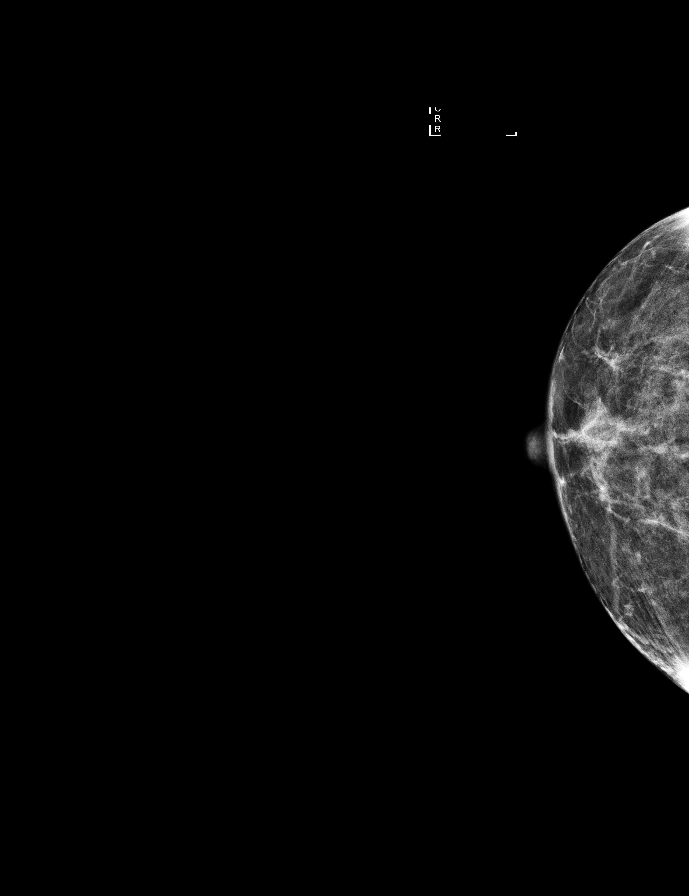

[R MLO (1 of 2)]
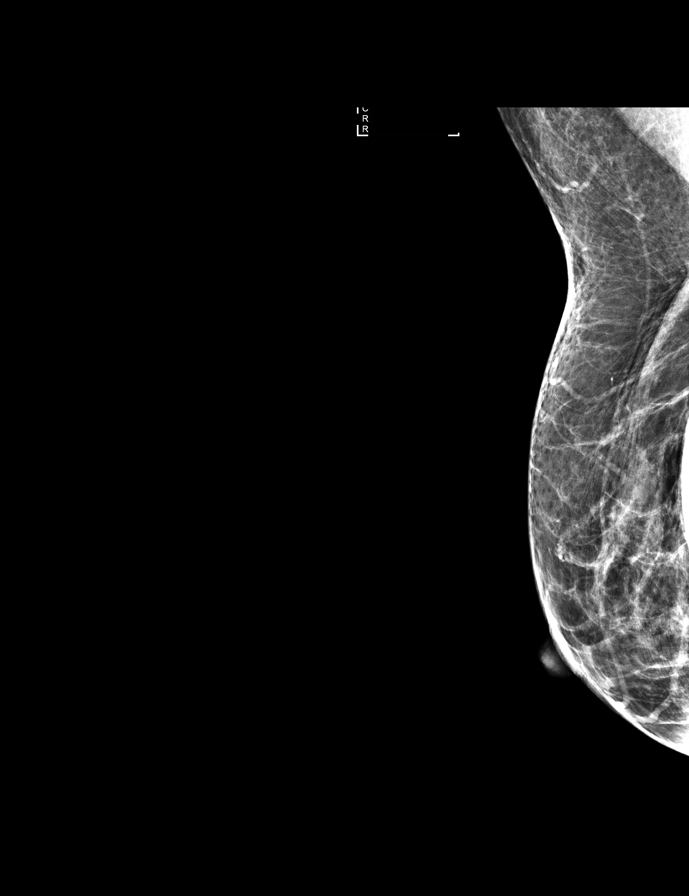

[L CC (1 of 2)]
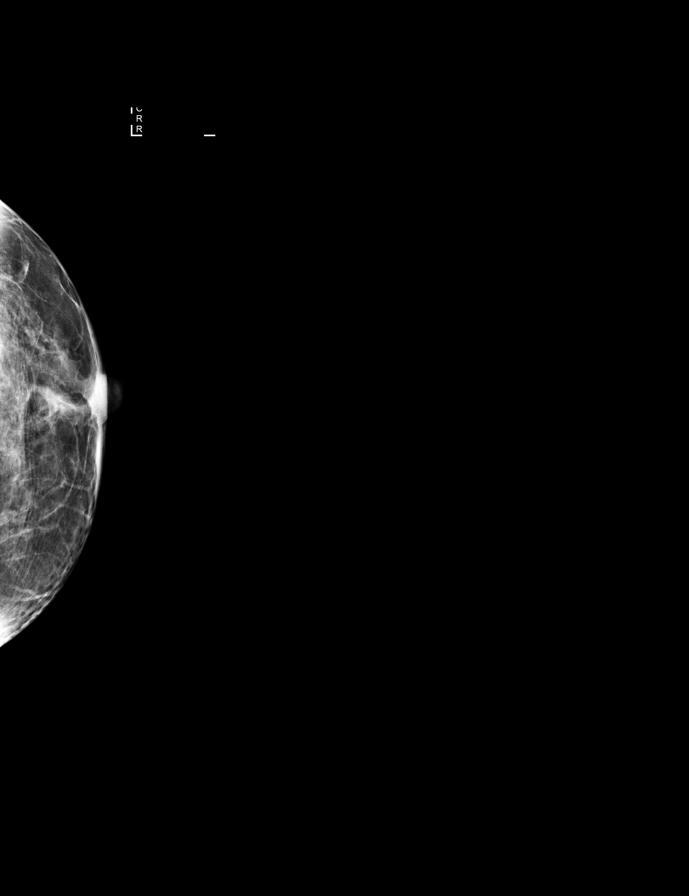

[R MLO (2 of 2)]
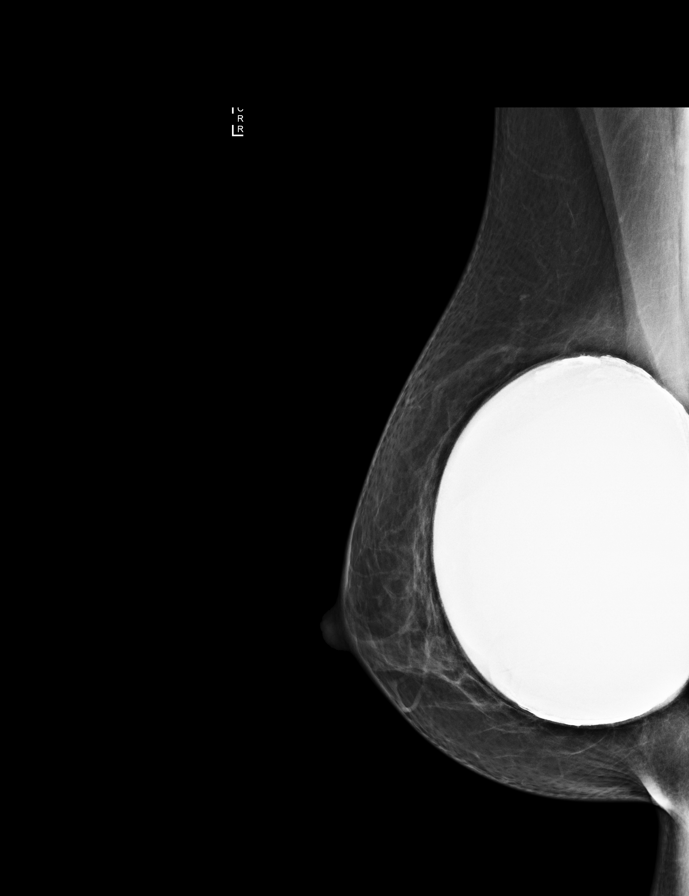

[L MLO (2 of 2)]
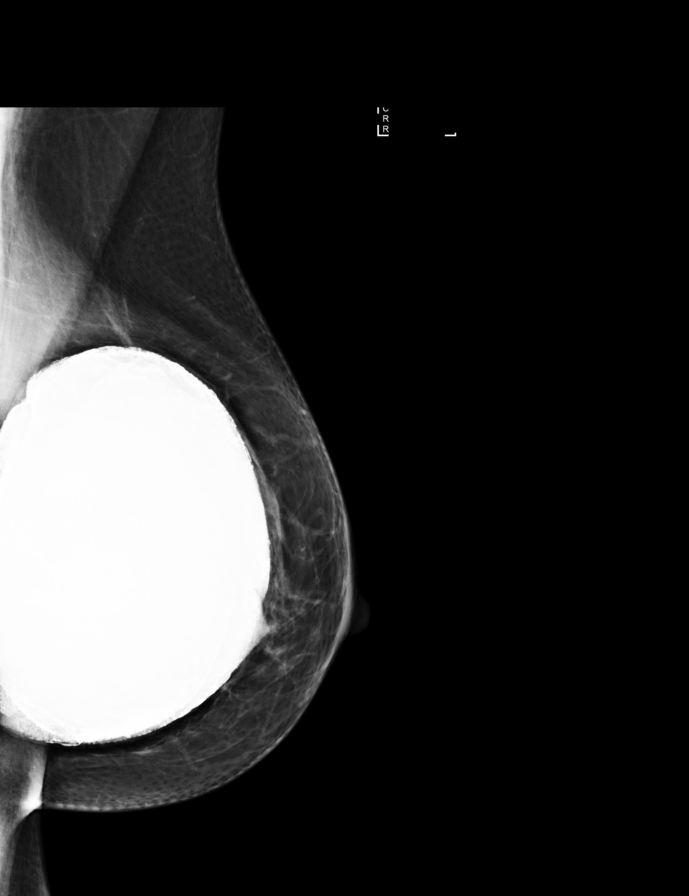

[L CC (2 of 2)]
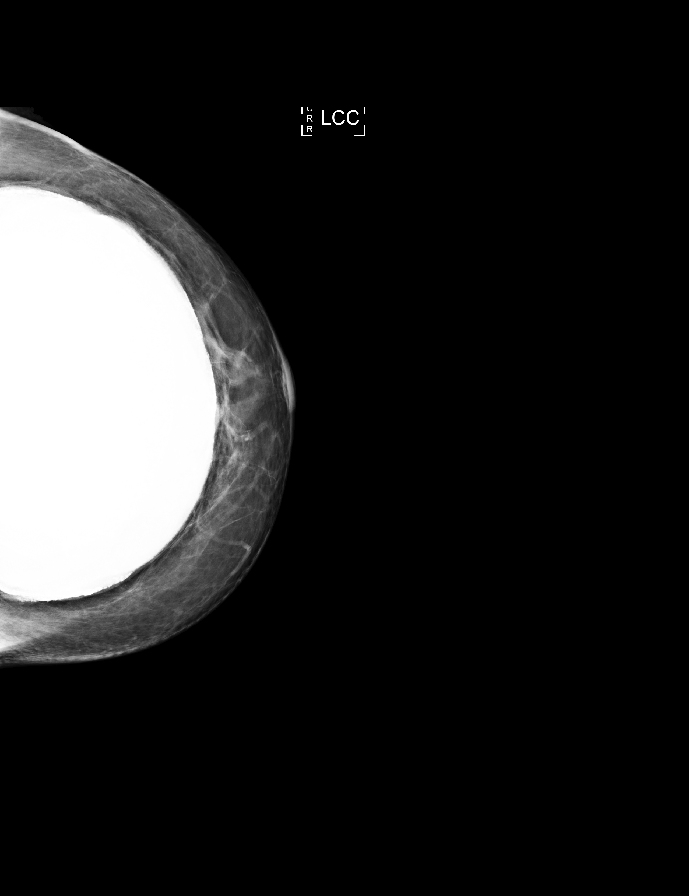

[8 of 8 positions shown; findings below may reference images not displayed]

ACR Breast Density Category c: The breast tissue is heterogeneously
dense, which may obscure small masses.
FINDINGS: There are no findings suspicious for malignancy. Images were
processed with CAD.
IMPRESSION: No mammographic evidence of malignancy. A result letter of this
screening mammogram will be mailed directly to the patient.

RECOMMENDATION:
Screening mammogram in one year. (Code:QZ-G-MEB)

BI-RADS CATEGORY  1:  Negative.

## 2018-04-10 ENCOUNTER — Ambulatory Visit: Payer: BLUE CROSS/BLUE SHIELD | Admitting: Nurse Practitioner

## 2018-04-10 ENCOUNTER — Encounter: Payer: Self-pay | Admitting: Nurse Practitioner

## 2018-04-10 VITALS — BP 124/80 | HR 90 | Temp 97.9°F | Resp 16 | Ht 66.0 in | Wt 141.2 lb

## 2018-04-10 DIAGNOSIS — Z23 Encounter for immunization: Secondary | ICD-10-CM

## 2018-04-10 DIAGNOSIS — M67431 Ganglion, right wrist: Secondary | ICD-10-CM | POA: Diagnosis not present

## 2018-04-10 NOTE — Progress Notes (Signed)
Name: Eileen Novak   MRN: [id number]    DOB: 07/19/1960   Date:04/10/2018       Progress Note  Subjective  Chief Complaint  Chief Complaint  Patient presents with  . Joint Swelling    lump on right wrist    HPI  Patient notes swollen lump on right wrist that appeared after she had trauma to the area years ago. States has become more painful, it is non-tender, but wrist is tender and states now she is having some pain in her fingers.   Patient Active Problem List   Diagnosis Date Noted  . DDD (degenerative disc disease), cervical 05/28/2017  . Gait disturbance 05/28/2017  . Thyroid disease 05/28/2017  . Foot pain 05/13/2017  . Elevated CK 05/13/2017  . Elevated alkaline phosphatase level 05/13/2017  . Knee pain, bilateral 05/10/2017  . Medication monitoring encounter 12/13/2016  . Tachycardia 12/07/2016  . Proximal humerus fracture 08/18/2016  . Chest pain 01/07/2016  . Hx of smoking 01/07/2016  . 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 10/27/2015  . Constipation 09/06/2015  . Anxiety disorder 08/29/2015  . Pain of right foot 08/29/2015  . Chronic left shoulder pain 08/29/2015  . Neck pain, chronic 08/29/2015  . Bilateral thoracic back pain 08/29/2015  . DEPRESSION 12/03/2006  . GERD 12/03/2006    Past Medical History:  Diagnosis Date  . Allergy   . Anxiety   . Depression   . Knee pain, chronic   . Shoulder pain   . Thyroid disease   . Tobacco abuse 01/07/2016    Past Surgical History:  Procedure Laterality Date  . AUGMENTATION MAMMAPLASTY Bilateral 1997  . CESAREAN SECTION    . FACIAL FRACTURE SURGERY    . FOOT SURGERY      Social History   Tobacco Use  . Smoking status: Former Smoker    Packs/day: 1.00    Years: 40.00    Pack years: 40.00    Types: Cigarettes    Last attempt to quit: 05/28/2016    Years since quitting: 1.8  . Smokeless tobacco: Never Used  Substance Use Topics  . Alcohol use: Yes    Alcohol/week: 0.0 standard drinks    Comment: occasionally      Current Outpatient Medications:  .  calcium carbonate (TUMS - DOSED IN MG ELEMENTAL CALCIUM) 500 MG chewable tablet, Chew 1 tablet by mouth as needed for indigestion or heartburn., Disp: , Rfl:  .  meloxicam (MOBIC) 7.5 MG tablet, Take 7.5 mg by mouth daily., Disp: , Rfl:  .  thyroid (ARMOUR) 30 MG tablet, Take by mouth., Disp: , Rfl:  .  levothyroxine (SYNTHROID, LEVOTHROID) 112 MCG tablet, Take 112 mcg by mouth daily before breakfast., Disp: , Rfl:  .  omeprazole (PRILOSEC) 20 MG capsule, Take 1 capsule (20 mg total) by mouth as needed. (Patient not taking: Reported on 04/10/2018), Disp: 30 capsule, Rfl: 1 .  predniSONE (STERAPRED UNI-PAK 48 TAB) 10 MG (48) TBPK tablet, See admin instructions., Disp: , Rfl: 0  Allergies  Allergen Reactions  . Escitalopram Oxalate     REACTION: nausea, decreased libito  . Penicillins     REACTION: as child    ROS   No other specific complaints in a complete review of systems (except as listed in HPI above).  Objective  Vitals:   04/10/18 1517  BP: 124/80  Pulse: 90  Resp: 16  Temp: 97.9 F (36.6 C)  TempSrc: Oral  SpO2: 97%  Weight: 141 lb 3.2 oz (64 kg)  Height: 5\' 6"  (1.676 m)    Body mass index is 22.79 kg/m.  Nursing Note and Vital Signs reviewed.  Physical Exam  Constitutional: She appears well-developed and well-nourished.  HENT:  Head: Normocephalic and atraumatic.  Cardiovascular: Normal rate.  Pulmonary/Chest: Effort normal.  Musculoskeletal: Normal range of motion. She exhibits deformity (non-tender transilluminated lump on dorsal wrist). She exhibits no edema or tenderness.  Skin: Skin is warm. No rash noted. No erythema.  Psychiatric: She has a normal mood and affect. Her behavior is normal.     No results found for this or any previous visit (from the past 48 hour(s)).  Assessment & Plan  1. Ganglion cyst of wrist, right OTC pain management has been ongoing for over a year states pain has worsened and may  be compressing nerve causing pain in fingers. Discussed aspiration has increased potential to re-occur; recommend surgery due to increased pain. - Ambulatory referral to General Surgery  2. Flu vaccine need - Flu Vaccine QUAD 36+ mos IM   Patient declined further health maintenance updates.

## 2018-04-10 NOTE — Patient Instructions (Addendum)
-   You have an appointment with [facility] [address], [place] with Dr. Satira MccallumJason Davis on 04/15/2018 at 11am. Please arrive at least 10-15 minutes before your appointment. You can call [phone] to reschedule for if you have any other questions  Ganglion Cyst A ganglion cyst is a noncancerous, fluid-filled lump that occurs near joints or tendons. The ganglion cyst grows out of a joint or the lining of a tendon. It most often develops in the hand or wrist, but it can also develop in the shoulder, elbow, hip, knee, ankle, or foot. The round or oval ganglion cyst can be the size of a pea or larger than a grape. Increased activity may enlarge the size of the cyst because more fluid starts to build up. What are the causes? It is not known what causes a ganglion cyst to grow. However, it may be related to:  Inflammation or irritation around the joint.  An injury.  Repetitive movements or overuse.  Arthritis.  What increases the risk? Risk factors include:  Being a woman.  Being age 56-50.  What are the signs or symptoms? Symptoms may include:  A lump. This most often appears on the hand or wrist, but it can occur in other areas of the body.  Tingling.  Pain.  Numbness.  Muscle weakness.  Weak grip.  Less movement in a joint.  How is this diagnosed? Ganglion cysts are most often diagnosed based on a physical exam. Your health care provider will feel the lump and may shine a light alongside it. If it is a ganglion cyst, a light often shines through it. Your health care provider may order an X-ray, ultrasound, or MRI to rule out other conditions. How is this treated? Ganglion cysts usually go away on their own without treatment. If pain or other symptoms are involved, treatment may be needed. Treatment is also needed if the ganglion cyst limits your movement or if it gets infected. Treatment may include:  Wearing a brace or splint on your wrist or  finger.  Taking anti-inflammatory medicine.  Draining fluid from the lump with a needle (aspiration).  Injecting a steroid into the joint.  Surgery to remove the ganglion cyst.  Follow these instructions at home:  Do not press on the ganglion cyst, poke it with a needle, or hit it.  Take medicines only as directed by your health care provider.  Wear your brace or splint as directed by your health care provider.  Watch your ganglion cyst for any changes.  Keep all follow-up visits as directed by your health care provider. This is important. Contact a health care provider if:  Your ganglion cyst becomes larger or more painful.  You have increased redness, red streaks, or swelling.  You have pus coming from the lump.  You have weakness or numbness in the affected area.  You have a fever or chills. This information is not intended to replace advice given to you by your health care provider. Make sure you discuss any questions you have with your health care provider. Document Released: 04/20/2000 Document Revised: 09/29/2015 Document Reviewed: 10/06/2013 Elsevier Interactive Patient Education  2018 [employer]

## 2018-04-15 ENCOUNTER — Encounter: Payer: Self-pay | Admitting: Surgery

## 2018-04-15 ENCOUNTER — Ambulatory Visit: Payer: Medicaid Other | Admitting: Surgery

## 2018-04-15 ENCOUNTER — Other Ambulatory Visit: Payer: Self-pay

## 2018-04-15 ENCOUNTER — Ambulatory Visit: Payer: BLUE CROSS/BLUE SHIELD | Admitting: Surgery

## 2018-04-15 VITALS — BP 134/82 | HR 81 | Temp 97.3°F | Resp 12 | Ht 66.0 in | Wt 142.0 lb

## 2018-04-15 DIAGNOSIS — M6740 Ganglion, unspecified site: Secondary | ICD-10-CM

## 2018-04-15 HISTORY — DX: Ganglion, unspecified site: M67.40

## 2018-04-15 NOTE — Progress Notes (Signed)
Surgical Clinic History and Physical  Referring provider:  Kerman Passey, MD [address] [place], [place] [address]  HISTORY OF PRESENT ILLNESS (HPI):  57 y.o. female presents for evaluation of Right wrist pain and swelling. Patient reports she fell while walking in March, 2018, injuring her Right shoulder and Right wrist. Over the past just >6 months, she's noted progressively worsening Right wrist pain associated with 2 focal swellings along her wrist at the levels of her thumb and 3rd finger. She says the pain has made it increasingly difficulty to provide care for her father and her son, for whom she describes she is the primary caregiver. She denies any redness, drainage, fever/chills, or additional falls and says one physician advised her to aspirate it while another advised her aspiration has an increased risk of recurrence and to have them excised. Patient states mid-wrist one in particular causes her the most pain, which radiates from her wrist with paresthesias through her finger tips.  PAST MEDICAL HISTORY (PMH):  Past Medical History:  Diagnosis Date  . Allergy   . Anxiety   . Depression   . Knee pain, chronic   . Shoulder pain   . Thyroid disease   . Tobacco abuse 01/07/2016    PAST SURGICAL HISTORY [facility]):  Past Surgical History:  Procedure Laterality Date  . AUGMENTATION MAMMAPLASTY Bilateral 1997  . CESAREAN SECTION    . COLONOSCOPY  2004  . FACIAL FRACTURE SURGERY    . FOOT SURGERY       MEDICATIONS:  Prior to Admission medications   Medication Sig Start Date End Date Taking? Authorizing Provider  calcium carbonate (TUMS - DOSED IN MG ELEMENTAL CALCIUM) 500 MG chewable tablet Chew 1 tablet by mouth as needed for indigestion or heartburn.   Yes [provider]  meloxicam (MOBIC) 7.5 MG tablet Take 7.5 mg by mouth daily.   Yes [provider]  thyroid (ARMOUR) 30 MG tablet Take by mouth. 03/26/18  Yes [provider]      ALLERGIES:  Allergies  Allergen Reactions  . Escitalopram Oxalate     REACTION: nausea, decreased libito  . Penicillins     REACTION: as child     SOCIAL HISTORY:  Social History   Socioeconomic History  . Marital status: Divorced    Spouse name: Not on file  . Number of children: 2  . Years of education: Not on file  . Highest education level: Not on file  Occupational History  . Not on file  Social Needs  . Financial resource strain: Not on file  . Food insecurity:    Worry: Not on file    Inability: Not on file  . Transportation needs:    Medical: Not on file    Non-medical: Not on file  Tobacco Use  . Smoking status: Former Smoker    Packs/day: 1.00    Years: 40.00    Pack years: 40.00    Types: Cigarettes    Last attempt to quit: 05/28/2016    Years since quitting: 1.8  . Smokeless tobacco: Never Used  Substance and Sexual Activity  . Alcohol use: Yes    Alcohol/week: 0.0 standard drinks    Comment: occasionally  . Drug use: No  . Sexual activity: Yes    Partners: Male    Birth control/protection: Post-menopausal  Lifestyle  . Physical activity:    Days per week: Not on file    Minutes per session: Not on file  .  Stress: Not on file  Relationships  . Social connections:    Talks on phone: Not on file    Gets together: Not on file    Attends religious service: Not on file    Active member of club or organization: Not on file    Attends meetings of clubs or organizations: Not on file    Relationship status: Not on file  . Intimate partner violence:    Fear of current or ex partner: Not on file    Emotionally abused: Not on file    Physically abused: Not on file    Forced sexual activity: Not on file  Other Topics Concern  . Not on file  Social History Narrative  . Not on file    The patient currently resides (home / rehab facility / nursing home): Home The patient normally is (ambulatory / bedbound): Ambulatory  FAMILY HISTORY:  Family  History  Problem Relation Age of Onset  . Depression Mother   . Breast cancer Mother 570  . Heart disease Father   . Depression Brother   . ADD / ADHD Brother     Otherwise negative/non-contributory.  REVIEW OF SYSTEMS:  Constitutional: denies any other weight loss, fever, chills, or sweats  Eyes: denies any other vision changes, history of eye injury  ENT: denies sore throat, hearing problems  Respiratory: denies shortness of breath, wheezing  Cardiovascular: denies chest pain, palpitations  Gastrointestinal: denies abdominal pain, N/V, or diarrhea Musculoskeletal: denies any other joint pains or cramps  Skin: Denies any other rashes or skin discolorations except as per HPI Neurological: denies any other headache, dizziness, weakness  Psychiatric: Denies any other depression, anxiety   All other review of systems were otherwise negative   VITAL SIGNS:  BP 134/82   Pulse 81   Temp (!) 97.3 F (36.3 C) (Skin)   Resp 12   Ht 5\' 6"  (1.676 m)   Wt 142 lb (64.4 kg)   SpO2 95%   BMI 22.92 kg/m   PHYSICAL EXAM:  Constitutional:  -- Normal body habitus  -- Awake, alert, and oriented x3  Eyes:  -- Pupils equally round and reactive to light  -- No scleral icterus  Ear, nose, throat:  -- No jugular venous distension -- No nasal drainage, bleeding Pulmonary:  -- No crackles  -- Equal breath sounds bilaterally -- Breathing non-labored at rest Cardiovascular:  -- S1, S2 present  -- No pericardial rubs  Gastrointestinal:  -- Abdomen soft, nontender, non-distended, no guarding/rebound  -- No abdominal masses appreciated, pulsatile or otherwise  Musculoskeletal and Integumentary:  -- Wounds or skin discoloration: None appreciated -- Extremities: B/L UE and LE FROM, hands and feet warm, no edema -- Tender to palpation Right wrist ganglion cysts x 2, the much more tender of the two being 1 cm well-circumscribed and firm at base of 3rd Right metacarpal, the less tender and  softer of the two being 1 cm at the base of patient's 1st metacarpal Neurologic:  -- Motor function: Intact and symmetric -- Sensation: Intact and symmetric  Labs:  CBC Latest Ref Rng & Units 05/10/2017 12/13/2016 01/03/2016  WBC 3.8 - 10.8 Thousand/uL 9.3 6.9 8.7  Hemoglobin 11.7 - 15.5 g/dL 16.112.5 11.5(L) 13.2  Hematocrit 35.0 - 45.0 % 36.9 35.0 37.8  Platelets 140 - 400 Thousand/uL 320 291 260   CMP Latest Ref Rng & Units 05/10/2017 12/13/2016 01/03/2016  Glucose 65 - 99 mg/dL 72 096(E117(H) 95  BUN 7 - 25 mg/dL 45(W29(H)  28(H) 36(H)  Creatinine 0.50 - 1.05 mg/dL 4.09 8.11 9.14(N)  Sodium 135 - 146 mmol/L 141 143 140  Potassium 3.5 - 5.3 mmol/L 4.2 4.2 4.0  Chloride 98 - 110 mmol/L 109 108 105  CO2 20 - 32 mmol/L 24 25 26   Calcium 8.6 - 10.4 mg/dL 9.3 82.9 56.2  Total Protein 6.1 - 8.1 g/dL 6.8 5.9(L) -  Total Bilirubin 0.2 - 1.2 mg/dL 1.3(Y) 0.2 -  Alkaline Phos 33 - 130 U/L - 114 -  AST 10 - 35 U/L 19 19 -  ALT 6 - 29 U/L 13 19 -   Imaging studies: No new pertinent imaging studies available for review at this time   Assessment/Plan:  57 y.o. female with debilitating painful Right 3rd > 1st carpal-metacarpal junction ganglion cysts, complicated by co-morbidities including thyroid disease (not otherwise specified), post-traumatic osteoarthritis, generalized anxiety disorder, major depression disorder, and former chronic tobacco abuse (smoking).   - etiology and natural history of ganglion cysts discussed, along with management options  - all risks (including risk of recurrence, aspiration > excision), benefits, and alternatives to in-office excision of Right 3rd carpal-metacarpal ganglion cyst (particularly considering far worse pain and paresthesias) and aspiration of thenar/1st carpal-metacarpal ganglion cyst were discussed with the patient, all of her questions were answered to her expressed satisfaction, patient expresses she wishes to proceed, and informed consent was obtained.  - will plan for  in-office excision of Right 3rd carpal-metacarpal ganglion cyst (particularly considering far worse pain and paresthesias) and aspiration of thenar/1st carpal-metacarpal ganglion cyst on an upcoming Thursday per patient request  - anticipate return to clinic 2 weeks following above planned procedure  - instructed to call if any questions or concerns  All of the above recommendations were discussed with the patient, and all of patient's questions were answered to her expressed satisfaction.  Thank you for the opportunity to participate in this patient's care.  -- Scherrie Gerlach Earlene Plater, MD, RPVI [place]: [facility] General Surgery - Partnering for exceptional care. Office: [phone]

## 2018-04-15 NOTE — Patient Instructions (Addendum)
Patient needs to be scheduled with Dr.Davis to remove ganglion cyst.  Call the office with any questions or concerns.    Ganglion Cyst A ganglion cyst is a noncancerous, fluid-filled lump that occurs near joints or tendons. The ganglion cyst grows out of a joint or the lining of a tendon. It most often develops in the hand or wrist, but it can also develop in the shoulder, elbow, hip, knee, ankle, or foot. The round or oval ganglion cyst can be the size of a pea or larger than a grape. Increased activity may enlarge the size of the cyst because more fluid starts to build up. What are the causes? It is not known what causes a ganglion cyst to grow. However, it may be related to:  Inflammation or irritation around the joint.  An injury.  Repetitive movements or overuse.  Arthritis.  What increases the risk? Risk factors include:  Being a woman.  Being age 56-50.  What are the signs or symptoms? Symptoms may include:  A lump. This most often appears on the hand or wrist, but it can occur in other areas of the body.  Tingling.  Pain.  Numbness.  Muscle weakness.  Weak grip.  Less movement in a joint.  How is this diagnosed? Ganglion cysts are most often diagnosed based on a physical exam. Your health care provider will feel the lump and may shine a light alongside it. If it is a ganglion cyst, a light often shines through it. Your health care provider may order an X-ray, ultrasound, or MRI to rule out other conditions. How is this treated? Ganglion cysts usually go away on their own without treatment. If pain or other symptoms are involved, treatment may be needed. Treatment is also needed if the ganglion cyst limits your movement or if it gets infected. Treatment may include:  Wearing a brace or splint on your wrist or finger.  Taking anti-inflammatory medicine.  Draining fluid from the lump with a needle (aspiration).  Injecting a steroid into the joint.  Surgery  to remove the ganglion cyst.  Follow these instructions at home:  Do not press on the ganglion cyst, poke it with a needle, or hit it.  Take medicines only as directed by your health care provider.  Wear your brace or splint as directed by your health care provider.  Watch your ganglion cyst for any changes.  Keep all follow-up visits as directed by your health care provider. This is important. Contact a health care provider if:  Your ganglion cyst becomes larger or more painful.  You have increased redness, red streaks, or swelling.  You have pus coming from the lump.  You have weakness or numbness in the affected area.  You have a fever or chills. This information is not intended to replace advice given to you by your health care provider. Make sure you discuss any questions you have with your health care provider. Document Released: 04/20/2000 Document Revised: 09/29/2015 Document Reviewed: 10/06/2013 Elsevier Interactive Patient Education  2018 [employer]

## 2018-04-24 ENCOUNTER — Ambulatory Visit: Payer: BLUE CROSS/BLUE SHIELD | Admitting: Surgery

## 2018-04-24 ENCOUNTER — Other Ambulatory Visit: Payer: Self-pay

## 2018-04-24 ENCOUNTER — Encounter: Payer: Self-pay | Admitting: Surgery

## 2018-04-24 VITALS — BP 115/74 | HR 71 | Temp 97.9°F | Resp 16 | Ht 66.0 in | Wt 140.8 lb

## 2018-04-24 DIAGNOSIS — M6740 Ganglion, unspecified site: Secondary | ICD-10-CM

## 2018-04-24 NOTE — Patient Instructions (Addendum)
Please see your follow up appointment listed below.  Please see yourWe have removed a Cyst in our office today.  You have sutures under the skin that will dissolve and also dermabond (skin glue) on top of your skin which will come off on it's own in 10-14 days.  You may shower in 48 hours, this is on 04/26/18  Avoid Strenuous activities that will make you sweat during the next 48 hours to avoid the glue coming off prematurely. Avoid activities that will place pressure to this area of the body for 1-2 weeks to avoid re-injury to incision site.  Please see your follow-up appointment provided. We will see you back in office to make sure this area is healed and to review the final pathology. If you have any questions or concerns prior to this appointment, call our office and speak with a nurse.    Excision of Skin Cysts or Lesions Excision of a skin lesion refers to the removal of a section of skin by making small cuts (incisions) in the skin. This procedure may be done to remove a cancerous (malignant) or noncancerous (benign) growth on the skin. It is typically done to treat or prevent cancer or infection. It may also be done to improve cosmetic appearance. The procedure may be done to remove:  Cancerous growths, such as basal cell carcinoma, squamous cell carcinoma, or melanoma.  Noncancerous growths, such as a cyst or lipoma.  Growths, such as moles or skin tags, which may be removed for cosmetic reasons.  Various excision or surgical techniques may be used depending on your condition, the location of the lesion, and your overall health. Tell a health care provider about:  Any allergies you have.  All medicines you are taking, including vitamins, herbs, eye drops, creams, and over-the-counter medicines.  Any problems you or family members have had with anesthetic medicines.  Any blood disorders you have.  Any surgeries you have had.  Any medical conditions you have.  Whether  you are pregnant or may be pregnant. What are the risks? Generally, this is a safe procedure. However, problems may occur, including:  Bleeding.  Infection.  Scarring.  Recurrence of the cyst, lipoma, or cancer.  Changes in skin sensation or appearance, such as discoloration or swelling.  Reaction to the anesthetics.  Allergic reaction to surgical materials or ointments.  Damage to nerves, blood vessels, muscles, or other structures.  Continued pain.  What happens before the procedure?  Ask your health care provider about: ? Changing or stopping your regular medicines. This is especially important if you are taking diabetes medicines or blood thinners. ? Taking medicines such as aspirin and ibuprofen. These medicines can thin your blood. Do not take these medicines before your procedure if your health care provider instructs you not to.  You may be asked to take certain medicines.  You may be asked to stop smoking.  You may have an exam or testing.  Plan to have someone take you home after the procedure.  Plan to have someone help you with activities during recovery. What happens during the procedure?  To reduce your risk of infection: ? Your health care team will wash or sanitize their hands. ? Your skin will be washed with soap.  You will be given a medicine to numb the area (local anesthetic).  One of the following excision techniques will be performed.  At the end of any of these procedures, antibiotic ointment will be applied as needed. Each of the  following techniques may vary among health care providers and hospitals. Complete Surgical Excision The area of skin that needs to be removed will be marked with a pen. Using a small scalpel or scissors, the surgeon will gently cut around and under the lesion until it is completely removed. The lesion will be placed in a fluid and sent to the lab for examination. If necessary, bleeding will be controlled with a device  that delivers heat (electrocautery). The edges of the wound may be stitched (sutured) together, and a bandage (dressing) will be applied. This procedure may be performed to treat a cancerous growth or a noncancerous cyst or lesion. Excision of a Cyst The surgeon will make an incision on the cyst. The entire cyst will be removed through the incision. The incision may be closed with sutures. Shave Excision During shave excision, the surgeon will use a small blade or an electrically heated loop instrument to shave off the lesion. This may be done to remove a mole or a skin tag. The wound will usually be left to heal on its own without sutures. Punch Excision During punch excision, the surgeon will use a small tool that is like a cookie cutter or a hole punch to cut a circle shape out of the skin. The outer edges of the skin will be sutured together. This may be done to remove a mole or a scar or to perform a biopsy of the lesion. Mohs Micrographic Surgery During Mohs micrographic surgery, layers of the lesion will be removed with a scalpel or a loop instrument and will be examined right away under a microscope. Layers will be removed until all of the abnormal or cancerous tissue has been removed. This procedure is minimally invasive, and it ensures the best cosmetic outcome. It involves the removal of as little normal tissue as possible. Mohs is usually done to treat skin cancer, such as basal cell carcinoma or squamous cell carcinoma, particularly on the face and ears. Depending on the size of the surgical wound, it may be sutured closed. What happens after the procedure?  Return to your normal activities as told by your health care provider.  Talk with your health care provider to discuss any test results, treatment options, and if necessary, the need for more tests. This information is not intended to replace advice given to you by your health care provider. Make sure you discuss any questions you have  with your health care provider. Document Released: 07/18/2009 Document Revised: 09/29/2015 Document Reviewed: 06/09/2014 Elsevier Interactive Patient Education  [employer]2018 [employer] Inc[employer]

## 2018-04-24 NOTE — Procedures (Signed)
SURGICAL OPERATIVE REPORT  DATE OF PROCEDURE: 04/24/2018  ATTENDING: Barbara CowerJason E. Earlene Plateravis, MD  ANESTHESIA: Local  PRE-OPERATIVE DIAGNOSIS: Debilitating painful Right 3rd > 1st carpal-metacarpal junction ganglion cysts (icd-10: M67.441)   POST-OPERATIVE DIAGNOSIS: Debilitating painful Right 3rd > 1st carpal-metacarpal junction ganglion cysts (icd-10: M67.441)   PROCEDURE(S):  1.) Excision of debilitating painful Right 3rd carpal-metacarpal junction ganglion cyst and decompression/drainage of 1st carpal-metacarpal junction ganglion cyst (cpt: 82956: 26160)   INTRAOPERATIVE FINDINGS: 1 cm x 1 cm Right 3rd and 1st carpal-metacarpal junction ganglion cysts in communication with each other, removed with disruption and decompressed/drained, respectively  INTRAVENOUS FLUIDS: 0 mL crystalloid   ESTIMATED BLOOD LOSS: Minimal (< 10 mL)  URINE OUTPUT: No Foley  SPECIMENS: Dorsal fragment of 1 cm x 1 cm Right 3rd carpal-metacarpal junction ganglion cyst  IMPLANTS: None  DRAINS: None  COMPLICATIONS: None apparent  CONDITION AT END OF PROCEDURE: Hemodynamically stable and awake  DISPOSITION OF PATIENT: PACU  INDICATIONS FOR PROCEDURE:  Patient is a 57 y.o. female who presented for evaluation of Right wrist pain and swelling after having fallen while walking in March, 2018, injuring her Right shoulder and Right wrist. Over the past just >6 months, she noted worsening Right wrist pain associated with 2 focal swellings along her wrist at the levels of her thumb and 3rd finger. She says the pain has made it increasingly difficulty to provide care for her father and her son, for whom she describes she is the primary caregiver. Patient stated her mid-wrist lesion in particular has caused her the most pain, which radiates from her wrist with paresthesias through her finger tips. All risks, benefits, and alternatives to above procedures were discussed with the patient, all of patient's questions were answered to  his expressed satisfaction, and informed consent was obtained and documented.   DETAILS OF PROCEDURE: Patient was brought to the procedure suite and was appropriately identified. Laying on the procedural table in supine position with her Right hand resting on her abdomen and palm facing down, operative site was prepped and draped in the usual sterile fashion. Following a brief time out, local anesthetic was injected over and surrounding the marked planned incision and surrounding the subcutaneous mass to be excised. A 1.5 cm wide transverse linear incision was made using a #15 blade scalpel, and incision was extended deep around subcutaneous mass using sharp + blunt dissection. During the course of dissecting free the cyst, it was disrupted and drained, proving communication between the 1 cm x 1 cm Right 3rd and 1st carpal-metacarpal junction ganglion cysts as the 1st carpal-metacarpal junction ganglion cyst resolved upon disruption and drainage of the 3rd carpal-metacarpal junction ganglion cyst, after which patient expressed immediate relief and dorsal fragment of 1 cm x 1 cm Right 3rd carpal-metacarpal junction ganglion cyst was excised. Direct pressure was applied for hemostasis, and the wound was copiously irrigated with unused local anesthetic. Deep tissue and dermis were re-approximated using buried interrupted 3-0 Vicryl suture, skin was then cleaned and dried, and sterile Dermabond skin glue was applied to the wound and allowed to dry.  I was present for all aspects of the above procedure, and no operative complications were apparent.

## 2018-04-25 ENCOUNTER — Telehealth: Payer: Self-pay | Admitting: *Deleted

## 2018-04-25 NOTE — Telephone Encounter (Signed)
Patient called and stated that she is swollen on top of hand, the whole hand it not swollen just the surgical area and is in pain, patient is taking aleve and tylenol but still hurting, she wants to know if this is normal.

## 2018-04-25 NOTE — Telephone Encounter (Signed)
Tried reaching patient however no answer.  Apply ice pack throughout the day for the next several days.  Keep hand above heart level.  Continue with taking aleve or ibuprofen and tylenol as discussed in the office.

## 2018-04-28 NOTE — Telephone Encounter (Signed)
Tried reaching patient again, no answer.

## 2018-05-08 ENCOUNTER — Encounter: Payer: Self-pay | Admitting: Surgery

## 2018-05-13 ENCOUNTER — Other Ambulatory Visit: Payer: Self-pay

## 2018-05-13 ENCOUNTER — Ambulatory Visit (INDEPENDENT_AMBULATORY_CARE_PROVIDER_SITE_OTHER): Payer: BLUE CROSS/BLUE SHIELD | Admitting: Surgery

## 2018-05-13 ENCOUNTER — Encounter: Payer: Self-pay | Admitting: Surgery

## 2018-05-13 VITALS — BP 116/74 | HR 84 | Temp 97.2°F | Resp 16 | Ht 66.0 in | Wt 141.8 lb

## 2018-05-13 DIAGNOSIS — Z4889 Encounter for other specified surgical aftercare: Secondary | ICD-10-CM

## 2018-05-13 DIAGNOSIS — M6740 Ganglion, unspecified site: Secondary | ICD-10-CM

## 2018-05-13 NOTE — Progress Notes (Signed)
Surgical Clinic Progress/Follow-up Note   HPI:  58 y.o. Female presents to clinic for post-op follow-up 19 Days s/p partial excision and decompression/drainage of 3rd > 1st carpal-metacarpal junction communicating ganglion cysts x2 Eileen Novak, 04/24/2018). Patient reports complete resolution of pre-operative pain with only mild soreness over the incision site, but otherwise happily reports resolved pain that was associated with her former ganglion cysts without any evidence to suggest recurrence. She denies N/V, fever/chills, CP, or SOB.  Review of Systems:  Constitutional: denies fever/chills  Respiratory: denies shortness of breath, wheezing  Cardiovascular: denies chest pain, palpitations  Skin: Denies any other rashes or skin discolorations except post-surgical wounds as per interval history  Vital Signs:  BP 116/74   Pulse 84   Temp (!) 97.2 F (36.2 C) (Tympanic)   Resp 16   Ht 5\' 6"  (1.676 m)   Wt 141 lb 12.8 oz (64.3 kg)   SpO2 98%   BMI 22.89 kg/m    Physical Exam:  Constitutional:  -- Normal body habitus  -- Awake, alert, and oriented x3  Pulmonary:  -- No crackles -- Equal breath sounds bilaterally -- Breathing non-labored at rest Cardiovascular:  -- S1, S2 present  -- No pericardial rubs  Musculoskeletal / Integumentary:  -- Wounds or skin discoloration: Essentially non-tender to palpation post-surgical Right hand incision well-approximated without any surrounding erythema or drainage, no evidence of ganglion cyst recurrence -- Extremities: B/L UE and LE FROM, hands and feet warm, no edema   Imaging: No new pertinent imaging available for review  Assessment:  58 y.o. yo Female with a problem list including...  Patient Active Problem List   Diagnosis Date Noted  . Ganglion cyst 04/15/2018  . DDD (degenerative disc disease), cervical 05/28/2017  . Gait disturbance 05/28/2017  . Thyroid disease 05/28/2017  . Foot pain 05/13/2017  . Elevated CK 05/13/2017  .  Elevated alkaline phosphatase level 05/13/2017  . Knee pain, bilateral 05/10/2017  . Medication monitoring encounter 12/13/2016  . Tachycardia 12/07/2016  . Proximal humerus fracture 08/18/2016  . Chest pain 01/07/2016  . Hx of smoking 01/07/2016  . 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 10/27/2015  . Constipation 09/06/2015  . Anxiety disorder 08/29/2015  . Pain of right foot 08/29/2015  . Chronic left shoulder pain 08/29/2015  . Neck pain, chronic 08/29/2015  . Bilateral thoracic back pain 08/29/2015  . DEPRESSION 12/03/2006  . GERD 12/03/2006    presents to clinic for post-op follow-up evaluation, doing well 19 Days s/p partial excision and decompression/drainage of 3rd > 1st carpal-metacarpal junction communicating ganglion cysts x2 Eileen Novak, 04/24/2018).  Plan:              - okay to submerge incisions under water (baths, swimming) prn  - continue to reduce strenuous Right hand activities and avoid Right hand/wrist trauma to reduce risk of recurrence and less likely wound complications             - apply sunblock particularly to incisions with sun exposure to reduce pigmentation of scars             - return to clinic as needed, instructed to call office if any questions or concerns  All of the above recommendations were discussed with the patient, and all of patient's questions were answered to her expressed satisfaction.  -- Scherrie Gerlach Eileen Plater, MD, RPVI [place]: [facility] General Surgery - Partnering for exceptional care. Office: [phone]

## 2018-05-13 NOTE — Patient Instructions (Signed)
Please call our office if you have questions or concerns.   

## 2018-05-14 ENCOUNTER — Ambulatory Visit: Payer: BLUE CROSS/BLUE SHIELD | Admitting: Nurse Practitioner

## 2021-05-10 ENCOUNTER — Other Ambulatory Visit: Payer: Self-pay | Admitting: Orthopaedic Surgery

## 2021-05-10 DIAGNOSIS — M4802 Spinal stenosis, cervical region: Secondary | ICD-10-CM

## 2021-05-17 ENCOUNTER — Ambulatory Visit
Admission: RE | Admit: 2021-05-17 | Discharge: 2021-05-17 | Disposition: A | Discharge status: Home / Self Care | Payer: BC Managed Care – PPO | Source: Ambulatory Visit | Attending: Orthopaedic Surgery | Admitting: Orthopaedic Surgery

## 2021-05-17 ENCOUNTER — Other Ambulatory Visit: Payer: Self-pay

## 2021-05-17 DIAGNOSIS — M4802 Spinal stenosis, cervical region: Secondary | ICD-10-CM | POA: Insufficient documentation

## 2021-06-30 ENCOUNTER — Other Ambulatory Visit: Payer: Self-pay | Admitting: Rehabilitation

## 2021-06-30 DIAGNOSIS — M48062 Spinal stenosis, lumbar region with neurogenic claudication: Secondary | ICD-10-CM

## 2021-07-11 ENCOUNTER — Ambulatory Visit: Payer: BC Managed Care – PPO

## 2021-07-21 ENCOUNTER — Other Ambulatory Visit: Payer: Self-pay

## 2021-07-21 ENCOUNTER — Ambulatory Visit
Admission: RE | Admit: 2021-07-21 | Discharge: 2021-07-21 | Disposition: A | Discharge status: Home / Self Care | Payer: BC Managed Care – PPO | Source: Ambulatory Visit | Attending: Rehabilitation | Admitting: Rehabilitation

## 2021-07-21 DIAGNOSIS — M48062 Spinal stenosis, lumbar region with neurogenic claudication: Secondary | ICD-10-CM | POA: Insufficient documentation

## 2021-09-25 ENCOUNTER — Other Ambulatory Visit: Payer: Self-pay | Admitting: Neurology

## 2021-09-25 DIAGNOSIS — M7989 Other specified soft tissue disorders: Secondary | ICD-10-CM

## 2021-09-25 DIAGNOSIS — M79605 Pain in left leg: Secondary | ICD-10-CM

## 2021-09-28 ENCOUNTER — Other Ambulatory Visit: Payer: Self-pay | Admitting: Neurology

## 2021-09-28 DIAGNOSIS — H532 Diplopia: Secondary | ICD-10-CM

## 2021-09-28 DIAGNOSIS — R519 Headache, unspecified: Secondary | ICD-10-CM

## 2021-10-04 ENCOUNTER — Ambulatory Visit
Admission: RE | Admit: 2021-10-04 | Discharge: 2021-10-04 | Disposition: A | Discharge status: Home / Self Care | Payer: BC Managed Care – PPO | Source: Ambulatory Visit | Attending: Neurology | Admitting: Neurology

## 2021-10-04 DIAGNOSIS — M79605 Pain in left leg: Secondary | ICD-10-CM | POA: Insufficient documentation

## 2021-10-04 DIAGNOSIS — N390 Urinary tract infection, site not specified: Secondary | ICD-10-CM | POA: Diagnosis not present

## 2021-10-04 DIAGNOSIS — A4151 Sepsis due to Escherichia coli [E. coli]: Secondary | ICD-10-CM | POA: Diagnosis not present

## 2021-10-04 DIAGNOSIS — M79604 Pain in right leg: Secondary | ICD-10-CM | POA: Insufficient documentation

## 2021-10-04 DIAGNOSIS — M7989 Other specified soft tissue disorders: Secondary | ICD-10-CM

## 2021-10-05 ENCOUNTER — Encounter: Payer: Self-pay | Admitting: Emergency Medicine

## 2021-10-05 ENCOUNTER — Other Ambulatory Visit: Payer: Self-pay

## 2021-10-05 ENCOUNTER — Inpatient Hospital Stay
Admission: EM | Admit: 2021-10-05 | Discharge: 2021-10-08 | DRG: 872 | Disposition: A | Discharge status: Home / Self Care | Payer: BC Managed Care – PPO | Attending: General Practice | Admitting: General Practice

## 2021-10-05 ENCOUNTER — Emergency Department: Payer: BC Managed Care – PPO

## 2021-10-05 DIAGNOSIS — Z8249 Family history of ischemic heart disease and other diseases of the circulatory system: Secondary | ICD-10-CM

## 2021-10-05 DIAGNOSIS — R110 Nausea: Secondary | ICD-10-CM | POA: Diagnosis present

## 2021-10-05 DIAGNOSIS — R29898 Other symptoms and signs involving the musculoskeletal system: Secondary | ICD-10-CM | POA: Diagnosis present

## 2021-10-05 DIAGNOSIS — E860 Dehydration: Secondary | ICD-10-CM | POA: Diagnosis present

## 2021-10-05 DIAGNOSIS — A4150 Gram-negative sepsis, unspecified: Secondary | ICD-10-CM | POA: Diagnosis present

## 2021-10-05 DIAGNOSIS — D649 Anemia, unspecified: Secondary | ICD-10-CM | POA: Diagnosis present

## 2021-10-05 DIAGNOSIS — Z818 Family history of other mental and behavioral disorders: Secondary | ICD-10-CM

## 2021-10-05 DIAGNOSIS — R262 Difficulty in walking, not elsewhere classified: Secondary | ICD-10-CM | POA: Diagnosis present

## 2021-10-05 DIAGNOSIS — Z87891 Personal history of nicotine dependence: Secondary | ICD-10-CM

## 2021-10-05 DIAGNOSIS — G894 Chronic pain syndrome: Secondary | ICD-10-CM | POA: Diagnosis present

## 2021-10-05 DIAGNOSIS — E876 Hypokalemia: Secondary | ICD-10-CM | POA: Diagnosis present

## 2021-10-05 DIAGNOSIS — Z803 Family history of malignant neoplasm of breast: Secondary | ICD-10-CM

## 2021-10-05 DIAGNOSIS — E039 Hypothyroidism, unspecified: Secondary | ICD-10-CM | POA: Diagnosis present

## 2021-10-05 DIAGNOSIS — Z880 Allergy status to penicillin: Secondary | ICD-10-CM

## 2021-10-05 DIAGNOSIS — Z1152 Encounter for screening for COVID-19: Secondary | ICD-10-CM

## 2021-10-05 DIAGNOSIS — F32A Depression, unspecified: Secondary | ICD-10-CM | POA: Diagnosis present

## 2021-10-05 DIAGNOSIS — J449 Chronic obstructive pulmonary disease, unspecified: Secondary | ICD-10-CM | POA: Diagnosis present

## 2021-10-05 DIAGNOSIS — N390 Urinary tract infection, site not specified: Secondary | ICD-10-CM | POA: Diagnosis present

## 2021-10-05 DIAGNOSIS — Z888 Allergy status to other drugs, medicaments and biological substances status: Secondary | ICD-10-CM

## 2021-10-05 DIAGNOSIS — Z79899 Other long term (current) drug therapy: Secondary | ICD-10-CM

## 2021-10-05 DIAGNOSIS — N179 Acute kidney failure, unspecified: Secondary | ICD-10-CM | POA: Diagnosis present

## 2021-10-05 DIAGNOSIS — K219 Gastro-esophageal reflux disease without esophagitis: Secondary | ICD-10-CM | POA: Diagnosis present

## 2021-10-05 DIAGNOSIS — M549 Dorsalgia, unspecified: Secondary | ICD-10-CM | POA: Diagnosis present

## 2021-10-05 DIAGNOSIS — F419 Anxiety disorder, unspecified: Secondary | ICD-10-CM | POA: Diagnosis present

## 2021-10-05 DIAGNOSIS — A419 Sepsis, unspecified organism: Principal | ICD-10-CM

## 2021-10-05 DIAGNOSIS — E282 Polycystic ovarian syndrome: Secondary | ICD-10-CM | POA: Diagnosis present

## 2021-10-05 DIAGNOSIS — Z79890 Hormone replacement therapy (postmenopausal): Secondary | ICD-10-CM

## 2021-10-05 DIAGNOSIS — A4151 Sepsis due to Escherichia coli [E. coli]: Principal | ICD-10-CM | POA: Diagnosis present

## 2021-10-05 DIAGNOSIS — Z791 Long term (current) use of non-steroidal anti-inflammatories (NSAID): Secondary | ICD-10-CM

## 2021-10-05 LAB — COMPREHENSIVE METABOLIC PANEL
ALT: 19 U/L (ref 0–44)
AST: 28 U/L (ref 15–41)
Albumin: 3.8 g/dL (ref 3.5–5.0)
Alkaline Phosphatase: 96 U/L (ref 38–126)
Anion gap: 10 (ref 5–15)
BUN: 27 mg/dL — ABNORMAL HIGH (ref 6–20)
CO2: 22 mmol/L (ref 22–32)
Calcium: 8.7 mg/dL — ABNORMAL LOW (ref 8.9–10.3)
Chloride: 106 mmol/L (ref 98–111)
Creatinine, Ser: 1.27 mg/dL — ABNORMAL HIGH (ref 0.44–1.00)
GFR, Estimated: 48 mL/min — ABNORMAL LOW (ref >60)
Glucose, Bld: 155 mg/dL — ABNORMAL HIGH (ref 70–99)
Potassium: 3.4 mmol/L — ABNORMAL LOW (ref 3.5–5.1)
Sodium: 138 mmol/L (ref 135–145)
Total Bilirubin: 0.5 mg/dL (ref 0.3–1.2)
Total Protein: 6.9 g/dL (ref 6.5–8.1)

## 2021-10-05 LAB — URINALYSIS, ROUTINE W REFLEX MICROSCOPIC
Bilirubin Urine: NEGATIVE
Glucose, UA: 50 mg/dL — AB
Ketones, ur: NEGATIVE mg/dL
Nitrite: POSITIVE — AB
Protein, ur: 30 mg/dL — AB
Specific Gravity, Urine: 1.014 (ref 1.005–1.030)
WBC, UA: >50 WBC/hpf — ABNORMAL HIGH (ref 0–5)
pH: 5 (ref 5.0–8.0)

## 2021-10-05 LAB — CBC WITH DIFFERENTIAL/PLATELET
Abs Immature Granulocytes: 0.02 10*3/uL (ref 0.00–0.07)
Basophils Absolute: 0 10*3/uL (ref 0.0–0.1)
Basophils Relative: 0 %
Eosinophils Absolute: 0 10*3/uL (ref 0.0–0.5)
Eosinophils Relative: 0 %
HCT: 37.3 % (ref 36.0–46.0)
Hemoglobin: 12.2 g/dL (ref 12.0–15.0)
Immature Granulocytes: 0 %
Lymphocytes Relative: 6 %
Lymphs Abs: 0.4 10*3/uL — ABNORMAL LOW (ref 0.7–4.0)
MCH: 31.4 pg (ref 26.0–34.0)
MCHC: 32.7 g/dL (ref 30.0–36.0)
MCV: 95.9 fL (ref 80.0–100.0)
Monocytes Absolute: 0.6 10*3/uL (ref 0.1–1.0)
Monocytes Relative: 8 %
Neutro Abs: 6.1 10*3/uL (ref 1.7–7.7)
Neutrophils Relative %: 86 %
Platelets: 230 10*3/uL (ref 150–400)
RBC: 3.89 MIL/uL (ref 3.87–5.11)
RDW: 13.3 % (ref 11.5–15.5)
WBC: 7.2 10*3/uL (ref 4.0–10.5)
nRBC: 0 % (ref 0.0–0.2)

## 2021-10-05 LAB — URINE DRUG SCREEN, QUALITATIVE (ARMC ONLY)
Amphetamines, Ur Screen: NOT DETECTED
Barbiturates, Ur Screen: NOT DETECTED
Benzodiazepine, Ur Scrn: NOT DETECTED
Cannabinoid 50 Ng, Ur ~~LOC~~: NOT DETECTED
Cocaine Metabolite,Ur ~~LOC~~: NOT DETECTED
MDMA (Ecstasy)Ur Screen: NOT DETECTED
Methadone Scn, Ur: NOT DETECTED
Opiate, Ur Screen: NOT DETECTED
Phencyclidine (PCP) Ur S: NOT DETECTED
Tricyclic, Ur Screen: POSITIVE — AB

## 2021-10-05 LAB — PROTIME-INR
INR: 0.9 (ref 0.8–1.2)
Prothrombin Time: 12.2 seconds (ref 11.4–15.2)

## 2021-10-05 LAB — RESP PANEL BY RT-PCR (FLU A&B, COVID) ARPGX2
Influenza A by PCR: NEGATIVE
Influenza B by PCR: NEGATIVE
SARS Coronavirus 2 by RT PCR: NEGATIVE

## 2021-10-05 LAB — APTT: aPTT: 31 seconds (ref 24–36)

## 2021-10-05 LAB — ETHANOL: Alcohol, Ethyl (B): <10 mg/dL (ref <10)

## 2021-10-05 MED ORDER — LACTATED RINGERS IV BOLUS
1000.0000 mL | Freq: Once | INTRAVENOUS | Status: AC
  Administered 2021-10-06: 1000 mL via INTRAVENOUS

## 2021-10-05 MED ORDER — SODIUM CHLORIDE 0.9 % IV SOLN
2.0000 g | INTRAVENOUS | Status: DC
  Administered 2021-10-06: 2 g via INTRAVENOUS
  Filled 2021-10-05: qty 20

## 2021-10-05 NOTE — ED Triage Notes (Signed)
Pt presents to ED with altered mental status. Pt husband states when he got home from work around 1745 pt was sitting in her car (had been for over an hour) and seemed to be confused and not making any sense when she is talking.  This morning pt last seemed to be at baseline around 10:00 am. Pt spoke to husband around 1500 and said she want not feeling well and was going home to rest.

## 2021-10-05 NOTE — ED Provider Notes (Signed)
[facility] Provider Note    Event Date/Time   First MD Initiated Contact with Patient 10/05/21 2302     (approximate)   History   Chief Complaint Altered Mental Status   HPI  Clint Montague-Skipper is a 61 y.o. female with past medical history of anxiety, GERD, and chronic pain syndrome who presents to the ED complaining of altered mental status.  Patient reports that she was feeling very tired and disoriented while driving her mother back to [place] earlier this morning.  She felt like her driving was erratic and she decided to take a nap upon arriving to her mother's house.  She then drove home later this afternoon, husband found her sitting outside in the car, where she apparently had been for over an hour.  She seemed disoriented and was having difficulty standing up out of the car.  On arrival to the ED, patient noted to have fever of 100.5.  Husband currently states that patient seems to be less confused than she was earlier today.  She states that she has been dealing with chronic back pain and difficulty walking for the past few months, for which she has been following with neurology.  This is unchanged today and she denies any new complaints.  She has not had any cough, chest pain, shortness of breath, nausea, vomiting, abdominal pain, flank pain, or dysuria.      Physical Exam   Triage Vital Signs: ED Triage Vitals  Enc Vitals Group     BP 10/05/21 1912 120/78     Pulse Rate 10/05/21 1912 (!) 130     Resp 10/05/21 1912 18     Temp 10/05/21 1912 (!) 100.5 F (38.1 C)     Temp Source 10/05/21 1912 Oral     SpO2 10/05/21 1912 94 %     Weight 10/05/21 1901 158 lb (71.7 kg)     Height 10/05/21 1901 5\' 6"  (1.676 m)     Head Circumference --      Peak Flow --      Pain Score 10/05/21 1858 8     Pain Loc --      Pain Edu? --      Excl. in [place]? --     Most recent vital signs: Vitals:   10/06/21 0020 10/06/21 0030  BP: 112/76 108/73  Pulse:  96   Resp:  16  Temp:    SpO2:  96%    Constitutional: Alert and oriented. Eyes: Conjunctivae are normal. Head: Atraumatic. Nose: No congestion/rhinnorhea. Mouth/Throat: Mucous membranes are moist.  Cardiovascular: Tachycardia, regular rhythm. Grossly normal heart sounds.  2+ radial pulses bilaterally. Respiratory: Normal respiratory effort.  No retractions. Lungs CTAB. Gastrointestinal: Soft and nontender. No distention. Musculoskeletal: No lower extremity tenderness nor edema.  Neurologic:  Normal speech and language. No gross focal neurologic deficits are appreciated.    ED Results / Procedures / Treatments   Labs (all labs ordered are listed, but only abnormal results are displayed) Labs Reviewed  URINE DRUG SCREEN, QUALITATIVE ([facility] ONLY) - Abnormal; Notable for the following components:      Result Value   Tricyclic, Ur Screen POSITIVE (*)    All other components within normal limits  URINALYSIS, ROUTINE W REFLEX MICROSCOPIC - Abnormal; Notable for the following components:   Color, Urine YELLOW (*)    APPearance HAZY (*)    Glucose, UA 50 (*)    Hgb urine dipstick MODERATE (*)    Protein, ur 30 (*)  Nitrite POSITIVE (*)    Leukocytes,Ua MODERATE (*)    WBC, UA >50 (*)    Bacteria, UA MANY (*)    All other components within normal limits  CBC WITH DIFFERENTIAL/PLATELET - Abnormal; Notable for the following components:   Lymphs Abs 0.4 (*)    All other components within normal limits  COMPREHENSIVE METABOLIC PANEL - Abnormal; Notable for the following components:   Potassium 3.4 (*)    Glucose, Bld 155 (*)    BUN 27 (*)    Creatinine, Ser 1.27 (*)    Calcium 8.7 (*)    GFR, Estimated 48 (*)    All other components within normal limits  RESP PANEL BY RT-PCR (FLU A&B, COVID) ARPGX2  URINE CULTURE  CULTURE, BLOOD (ROUTINE X 2)  CULTURE, BLOOD (ROUTINE X 2)  ETHANOL  PROTIME-INR  APTT  LACTIC ACID, PLASMA  LACTIC ACID, PLASMA     EKG  ED ECG REPORT I,  Blake Divine, the attending physician, personally viewed and interpreted this ECG.   Date: 10/05/2021  EKG Time: 19:18  Rate: 132  Rhythm: sinus tachycardia  Axis: Normal  Intervals:none  ST&T Change: None  RADIOLOGY CT head reviewed and interpreted by me with no hemorrhage or midline shift.  PROCEDURES:  Critical Care performed: Yes, see critical care procedure note(s)  .Critical Care Performed by: Blake Divine, MD Authorized by: Blake Divine, MD   Critical care provider statement:    Critical care time (minutes):  30   Critical care time was exclusive of:  Separately billable procedures and treating other patients and teaching time   Critical care was necessary to treat or prevent imminent or life-threatening deterioration of the following conditions:  Sepsis   Critical care was time spent personally by me on the following activities:  Development of treatment plan with patient or surrogate, discussions with consultants, evaluation of patient's response to treatment, examination of patient, ordering and review of laboratory studies, ordering and review of radiographic studies, ordering and performing treatments and interventions, pulse oximetry, re-evaluation of patient's condition and review of old charts   I assumed direction of critical care for this patient from another provider in my specialty: no     Care discussed with: admitting provider     MEDICATIONS ORDERED IN ED: Medications  cefTRIAXone (ROCEPHIN) 2 g in sodium chloride 0.9 % 100 mL IVPB (2 g Intravenous New Bag/Given 10/06/21 0028)  lactated ringers bolus 1,000 mL (1,000 mLs Intravenous New Bag/Given 10/06/21 0030)     IMPRESSION / MDM / [place] / ED COURSE  I reviewed the triage vital signs and the nursing notes.                              61 y.o. female with past medical history of anxiety, GERD, and chronic pain syndrome who presents to the ED complaining of fatigue and disorientation  starting earlier today, noted to be febrile here in the ED.  Patient's presentation is most consistent with acute presentation with potential threat to life or bodily function.  Differential diagnosis includes, but is not limited to, sepsis, UTI, pneumonia, intracranial process, electrolyte abnormality, medication effect.  Patient well-appearing and in no acute distress, however with fever and tachycardia presentation is concerning for sepsis.  Urinalysis does appear consistent with UTI, we will send for culture and start on IV Rocephin.  Blood cultures and lactate drawn, CBC shows no anemia or leukocytosis, BMP  remarkable for mild AKI but with no significant electrolyte abnormality.  CT head is negative for acute process.  Chest x-ray shows no evidence of infiltrate to suggest pneumonia, testing for COVID-19 and influenza is negative.  Case discussed with hospitalist for admission due to sepsis secondary to UTI.      FINAL CLINICAL IMPRESSION(S) / ED DIAGNOSES   Final diagnoses:  Sepsis without acute organ dysfunction, due to unspecified organism ([place])  AKI (acute kidney injury) ([place])  Lower urinary tract infectious disease     Rx / DC Orders   ED Discharge Orders     None        Note:  This document was prepared using Dragon voice recognition software and may include unintentional dictation errors.   Blake Divine, MD 10/06/21 [phone]

## 2021-10-05 NOTE — Progress Notes (Signed)
CODE SEPSIS - PHARMACY COMMUNICATION  **Broad Spectrum Antibiotics should be administered within 1 hour of Sepsis diagnosis**  Time Code Sepsis Called/Page Received: 2330  Antibiotics Ordered: Ceftriaxone  Time of 1st antibiotic administration: 0028  Otelia Sergeant, PharmD, MBA 10/05/2021 11:30 PM

## 2021-10-06 ENCOUNTER — Other Ambulatory Visit: Payer: Self-pay

## 2021-10-06 DIAGNOSIS — Z87891 Personal history of nicotine dependence: Secondary | ICD-10-CM | POA: Diagnosis not present

## 2021-10-06 DIAGNOSIS — Z818 Family history of other mental and behavioral disorders: Secondary | ICD-10-CM | POA: Diagnosis not present

## 2021-10-06 DIAGNOSIS — A4151 Sepsis due to Escherichia coli [E. coli]: Secondary | ICD-10-CM | POA: Diagnosis present

## 2021-10-06 DIAGNOSIS — G894 Chronic pain syndrome: Secondary | ICD-10-CM | POA: Diagnosis present

## 2021-10-06 DIAGNOSIS — F32A Depression, unspecified: Secondary | ICD-10-CM | POA: Diagnosis present

## 2021-10-06 DIAGNOSIS — Z803 Family history of malignant neoplasm of breast: Secondary | ICD-10-CM | POA: Diagnosis not present

## 2021-10-06 DIAGNOSIS — R29898 Other symptoms and signs involving the musculoskeletal system: Secondary | ICD-10-CM | POA: Diagnosis present

## 2021-10-06 DIAGNOSIS — R110 Nausea: Secondary | ICD-10-CM | POA: Diagnosis present

## 2021-10-06 DIAGNOSIS — Z888 Allergy status to other drugs, medicaments and biological substances status: Secondary | ICD-10-CM | POA: Diagnosis not present

## 2021-10-06 DIAGNOSIS — N179 Acute kidney failure, unspecified: Secondary | ICD-10-CM | POA: Diagnosis present

## 2021-10-06 DIAGNOSIS — E860 Dehydration: Secondary | ICD-10-CM | POA: Diagnosis present

## 2021-10-06 DIAGNOSIS — A419 Sepsis, unspecified organism: Secondary | ICD-10-CM | POA: Diagnosis not present

## 2021-10-06 DIAGNOSIS — N390 Urinary tract infection, site not specified: Secondary | ICD-10-CM | POA: Diagnosis present

## 2021-10-06 DIAGNOSIS — E039 Hypothyroidism, unspecified: Secondary | ICD-10-CM | POA: Diagnosis present

## 2021-10-06 DIAGNOSIS — E876 Hypokalemia: Secondary | ICD-10-CM | POA: Diagnosis present

## 2021-10-06 DIAGNOSIS — A4150 Gram-negative sepsis, unspecified: Secondary | ICD-10-CM

## 2021-10-06 DIAGNOSIS — R262 Difficulty in walking, not elsewhere classified: Secondary | ICD-10-CM | POA: Diagnosis present

## 2021-10-06 DIAGNOSIS — Z1152 Encounter for screening for COVID-19: Secondary | ICD-10-CM | POA: Diagnosis not present

## 2021-10-06 DIAGNOSIS — E282 Polycystic ovarian syndrome: Secondary | ICD-10-CM | POA: Diagnosis present

## 2021-10-06 DIAGNOSIS — F419 Anxiety disorder, unspecified: Secondary | ICD-10-CM

## 2021-10-06 DIAGNOSIS — Z79899 Other long term (current) drug therapy: Secondary | ICD-10-CM | POA: Diagnosis not present

## 2021-10-06 DIAGNOSIS — K219 Gastro-esophageal reflux disease without esophagitis: Secondary | ICD-10-CM

## 2021-10-06 DIAGNOSIS — J449 Chronic obstructive pulmonary disease, unspecified: Secondary | ICD-10-CM | POA: Diagnosis present

## 2021-10-06 DIAGNOSIS — M549 Dorsalgia, unspecified: Secondary | ICD-10-CM | POA: Diagnosis present

## 2021-10-06 DIAGNOSIS — Z8249 Family history of ischemic heart disease and other diseases of the circulatory system: Secondary | ICD-10-CM | POA: Diagnosis not present

## 2021-10-06 DIAGNOSIS — D649 Anemia, unspecified: Secondary | ICD-10-CM | POA: Diagnosis present

## 2021-10-06 DIAGNOSIS — Z880 Allergy status to penicillin: Secondary | ICD-10-CM | POA: Diagnosis not present

## 2021-10-06 LAB — CBC
HCT: 31.9 % — ABNORMAL LOW (ref 36.0–46.0)
Hemoglobin: 10.5 g/dL — ABNORMAL LOW (ref 12.0–15.0)
MCH: 31.7 pg (ref 26.0–34.0)
MCHC: 32.9 g/dL (ref 30.0–36.0)
MCV: 96.4 fL (ref 80.0–100.0)
Platelets: 213 10*3/uL (ref 150–400)
RBC: 3.31 MIL/uL — ABNORMAL LOW (ref 3.87–5.11)
RDW: 13.2 % (ref 11.5–15.5)
WBC: 8.6 10*3/uL (ref 4.0–10.5)
nRBC: 0 % (ref 0.0–0.2)

## 2021-10-06 LAB — BASIC METABOLIC PANEL
Anion gap: 3 — ABNORMAL LOW (ref 5–15)
BUN: 19 mg/dL (ref 6–20)
CO2: 25 mmol/L (ref 22–32)
Calcium: 8.4 mg/dL — ABNORMAL LOW (ref 8.9–10.3)
Chloride: 113 mmol/L — ABNORMAL HIGH (ref 98–111)
Creatinine, Ser: 1.06 mg/dL — ABNORMAL HIGH (ref 0.44–1.00)
GFR, Estimated: >60 mL/min (ref >60)
Glucose, Bld: 128 mg/dL — ABNORMAL HIGH (ref 70–99)
Potassium: 3.6 mmol/L (ref 3.5–5.1)
Sodium: 141 mmol/L (ref 135–145)

## 2021-10-06 LAB — CORTISOL-AM, BLOOD: Cortisol - AM: 3.9 ug/dL — ABNORMAL LOW (ref 6.7–22.6)

## 2021-10-06 LAB — PROTIME-INR
INR: 1 (ref 0.8–1.2)
Prothrombin Time: 13.3 seconds (ref 11.4–15.2)

## 2021-10-06 LAB — LACTIC ACID, PLASMA
Lactic Acid, Venous: 1.5 mmol/L (ref 0.5–1.9)
Lactic Acid, Venous: 0.9 mmol/L (ref 0.5–1.9)

## 2021-10-06 LAB — PROCALCITONIN: Procalcitonin: 4.39 ng/mL

## 2021-10-06 LAB — HIV ANTIBODY (ROUTINE TESTING W REFLEX): HIV Screen 4th Generation wRfx: NONREACTIVE

## 2021-10-06 MED ORDER — ACETAMINOPHEN 325 MG PO TABS
650.0000 mg | ORAL_TABLET | Freq: Four times a day (QID) | ORAL | Status: DC | PRN
  Administered 2021-10-06 – 2021-10-08 (×4): 650 mg via ORAL
  Filled 2021-10-06 (×4): qty 2

## 2021-10-06 MED ORDER — BLISTEX MEDICATED EX OINT
TOPICAL_OINTMENT | CUTANEOUS | Status: DC | PRN
  Filled 2021-10-06: qty 6.3

## 2021-10-06 MED ORDER — CYCLOBENZAPRINE HCL 10 MG PO TABS
5.0000 mg | ORAL_TABLET | Freq: Every evening | ORAL | Status: DC | PRN
  Administered 2021-10-06: 5 mg via ORAL
  Filled 2021-10-06: qty 1

## 2021-10-06 MED ORDER — SODIUM CHLORIDE 0.9 % IV SOLN: INTRAVENOUS | Status: DC

## 2021-10-06 MED ORDER — ONDANSETRON HCL 4 MG PO TABS: 4.0000 mg | ORAL_TABLET | Freq: Four times a day (QID) | ORAL | Status: DC | PRN

## 2021-10-06 MED ORDER — QUETIAPINE FUMARATE 25 MG PO TABS
50.0000 mg | ORAL_TABLET | Freq: Every day | ORAL | Status: DC
  Administered 2021-10-07: 50 mg via ORAL
  Filled 2021-10-06 (×2): qty 2

## 2021-10-06 MED ORDER — MAGNESIUM HYDROXIDE 400 MG/5ML PO SUSP: 30.0000 mL | Freq: Every day | ORAL | Status: DC | PRN

## 2021-10-06 MED ORDER — SODIUM CHLORIDE 0.9 % IV SOLN
2.0000 g | INTRAVENOUS | Status: DC
  Administered 2021-10-07 – 2021-10-08 (×2): 2 g via INTRAVENOUS
  Filled 2021-10-06: qty 20
  Filled 2021-10-06: qty 2

## 2021-10-06 MED ORDER — ALPRAZOLAM 0.5 MG PO TABS
0.2500 mg | ORAL_TABLET | Freq: Every day | ORAL | Status: DC | PRN
  Administered 2021-10-06 (×2): 0.25 mg via ORAL
  Filled 2021-10-06 (×4): qty 1

## 2021-10-06 MED ORDER — PANTOPRAZOLE SODIUM 40 MG PO TBEC
40.0000 mg | DELAYED_RELEASE_TABLET | Freq: Every day | ORAL | Status: DC
  Administered 2021-10-06 – 2021-10-08 (×3): 40 mg via ORAL
  Filled 2021-10-06 (×3): qty 1

## 2021-10-06 MED ORDER — VENLAFAXINE HCL ER 150 MG PO CP24
150.0000 mg | ORAL_CAPSULE | Freq: Every day | ORAL | Status: DC
  Administered 2021-10-06 – 2021-10-08 (×3): 150 mg via ORAL
  Filled 2021-10-06 (×3): qty 1

## 2021-10-06 MED ORDER — PREGABALIN 75 MG PO CAPS: 200.0000 mg | ORAL_CAPSULE | Freq: Two times a day (BID) | ORAL | Status: DC

## 2021-10-06 MED ORDER — PREGABALIN 75 MG PO CAPS
200.0000 mg | ORAL_CAPSULE | Freq: Two times a day (BID) | ORAL | Status: DC
  Administered 2021-10-06 – 2021-10-08 (×5): 200 mg via ORAL
  Filled 2021-10-06 (×5): qty 1

## 2021-10-06 MED ORDER — PREGABALIN 75 MG PO CAPS
200.0000 mg | ORAL_CAPSULE | Freq: Once | ORAL | Status: AC
  Administered 2021-10-06: 200 mg via ORAL
  Filled 2021-10-06: qty 1

## 2021-10-06 MED ORDER — THYROID 30 MG PO TABS
30.0000 mg | ORAL_TABLET | Freq: Every day | ORAL | Status: DC
  Administered 2021-10-06 – 2021-10-08 (×3): 30 mg via ORAL
  Filled 2021-10-06 (×3): qty 1

## 2021-10-06 MED ORDER — ONDANSETRON HCL 4 MG/2ML IJ SOLN
4.0000 mg | Freq: Four times a day (QID) | INTRAMUSCULAR | Status: DC | PRN
  Administered 2021-10-06 (×2): 4 mg via INTRAVENOUS
  Filled 2021-10-06 (×2): qty 2

## 2021-10-06 MED ORDER — CALCIUM CARBONATE ANTACID 500 MG PO CHEW
1.0000 | CHEWABLE_TABLET | ORAL | Status: DC | PRN
Start: 2021-10-06 — End: 2021-10-08
  Administered 2021-10-06: 200 mg via ORAL
  Filled 2021-10-06: qty 1

## 2021-10-06 MED ORDER — TRAZODONE HCL 50 MG PO TABS
25.0000 mg | ORAL_TABLET | Freq: Every evening | ORAL | Status: DC | PRN
  Administered 2021-10-06: 25 mg via ORAL
  Filled 2021-10-06: qty 1

## 2021-10-06 MED ORDER — ARIPIPRAZOLE 5 MG PO TABS: 5.0000 mg | ORAL_TABLET | Freq: Every day | ORAL | Status: DC

## 2021-10-06 MED ORDER — POTASSIUM CHLORIDE 20 MEQ PO PACK
40.0000 meq | PACK | Freq: Once | ORAL | Status: AC
  Administered 2021-10-06: 40 meq via ORAL
  Filled 2021-10-06: qty 2

## 2021-10-06 MED ORDER — ENOXAPARIN SODIUM 40 MG/0.4ML IJ SOSY
40.0000 mg | PREFILLED_SYRINGE | INTRAMUSCULAR | Status: DC
  Administered 2021-10-06 – 2021-10-07 (×2): 40 mg via SUBCUTANEOUS
  Filled 2021-10-06 (×2): qty 0.4

## 2021-10-06 MED ORDER — ACETAMINOPHEN 325 MG RE SUPP: 650.0000 mg | Freq: Four times a day (QID) | RECTAL | Status: DC | PRN

## 2021-10-06 MED ORDER — NORTRIPTYLINE HCL 10 MG PO CAPS
10.0000 mg | ORAL_CAPSULE | Freq: Every day | ORAL | Status: DC
  Administered 2021-10-06 – 2021-10-07 (×3): 10 mg via ORAL
  Filled 2021-10-06 (×3): qty 1

## 2021-10-06 MED ORDER — FAMOTIDINE 20 MG PO TABS
40.0000 mg | ORAL_TABLET | Freq: Every day | ORAL | Status: DC
  Administered 2021-10-06 – 2021-10-07 (×3): 40 mg via ORAL
  Filled 2021-10-06 (×3): qty 2

## 2021-10-06 NOTE — Assessment & Plan Note (Addendum)
Creatinine started improving with IV hydration.  This is likely prerenal due to volume depletion and dehydration. - She will be hydrated with IV normal saline. - We will hold off nephrotoxins. - We will follow BMP.

## 2021-10-06 NOTE — Assessment & Plan Note (Addendum)
Met sepsis criteria with fever, tachycardia.  Procalcitonin elevated at 4.39 .  Preliminary blood cultures negative in 12 hours.  Urine cultures pending, UA positive for nitrite and leukocytes. -Continue with ceftriaxone -Follow-up cultures -Continue supportive care

## 2021-10-06 NOTE — Assessment & Plan Note (Signed)
-   This is likely secondary to generalized weakness with her sepsis. - PT consult will be obtained.

## 2021-10-06 NOTE — H&P (Signed)
[place]   PATIENT NAME: Eileen Novak    MR#:  [id number]  DATE OF BIRTH:  Jul 30, 1960  DATE OF ADMISSION:  10/05/2021  PRIMARY CARE PHYSICIAN: Derinda Late, MD   Patient is coming from: Home  REQUESTING/REFERRING PHYSICIAN: Blake Divine, MD  CHIEF COMPLAINT:   Chief Complaint  Patient presents with  . Altered Mental Status    HISTORY OF PRESENT ILLNESS:  Eileen Novak is a 61 y.o. female with medical history significant for hypothyroidism, PCOS, depression and anxiety, presented to the ER with acute onset of altered mental status with confusion.  She stated that she was talking out of her had and she was feeling weak with pain all over her legs especially her knees and subsequent inability to walk.  She admits to nausea without vomiting or diarrhea.  She had low-grade fever 100.5.  No chest pain or dyspnea or cough or wheezing.  She admits to urinary frequency and urgency with dysuria without flank pain.  ED Course: When she came to the ER heart rate was 130 with temperature 38.1 and otherwise vital signs were within normal.  Labs revealed a creatinine 1.06 with BUN of 19 calcium of 8.4.  Procalcitonin was 4.39 and lactic acid 1.5  and later 0.9.  CBC showed anemia with normal PT and INR.   EKG as reviewed by me : She showed sinus tachycardia with a rate of 132 with Q waves laterally. Imaging: Two-view chest x-ray showed stable COPD.  Noncontrasted CT scan revealed no abnormalities.  The patient was given 2 g of IV Rocephin, 1 L bolus of 5 lactated Ringer and replacement for mild hyperkalemia.  She is admitted to a medical bed for further evaluation and management.   PAST MEDICAL HISTORY:   Past Medical History:  Diagnosis Date  . Allergy   . Anxiety   . Depression   . Ganglion cyst 04/15/2018  . Knee pain, chronic   . Shoulder pain   . Thyroid disease   . Tobacco abuse 01/07/2016    PAST SURGICAL HISTORY:   Past Surgical History:   Procedure Laterality Date  . AUGMENTATION MAMMAPLASTY Bilateral 1997  . CESAREAN SECTION    . COLONOSCOPY  2004  . FACIAL FRACTURE SURGERY    . FOOT SURGERY      SOCIAL HISTORY:   Social History   Tobacco Use  . Smoking status: Former    Packs/day: 1.00    Years: 40.00    Pack years: 40.00    Types: Cigarettes    Quit date: 05/28/2016    Years since quitting: 5.3  . Smokeless tobacco: Never  Substance Use Topics  . Alcohol use: Yes    Alcohol/week: 0.0 standard drinks    Comment: occasionally    FAMILY HISTORY:   Family History  Problem Relation Age of Onset  . Depression Mother   . Breast cancer Mother 70  . Heart disease Father   . Depression Brother   . ADD / ADHD Brother     DRUG ALLERGIES:   Allergies  Allergen Reactions  . Escitalopram Oxalate     REACTION: nausea, decreased libito  . Penicillins     REACTION: as child    REVIEW OF SYSTEMS:   ROS As per history of present illness. All pertinent systems were reviewed above. Constitutional, HEENT, cardiovascular, respiratory, GI, GU, musculoskeletal, neuro, psychiatric, endocrine, integumentary and hematologic systems were reviewed and are otherwise negative/unremarkable except for positive findings mentioned above in the  HPI.   MEDICATIONS AT HOME:   Prior to Admission medications   Medication Sig Start Date End Date Taking? Authorizing Provider  ALPRAZolam (XANAX) 0.25 MG tablet Take 0.25 mg by mouth daily as needed. 05/31/21  Yes [provider]  ARIPiprazole (ABILIFY) 5 MG tablet Take 5 mg by mouth at bedtime. 09/27/21  Yes [provider]  calcium carbonate (TUMS - DOSED IN MG ELEMENTAL CALCIUM) 500 MG chewable tablet Chew 1 tablet by mouth as needed for indigestion or heartburn.   Yes [provider]  cyclobenzaprine (FLEXERIL) 5 MG tablet Take 5 mg by mouth at bedtime as needed. 06/22/21  Yes [provider]  famotidine (PEPCID) 40 MG tablet Take 40 mg by mouth  at bedtime. 04/25/21  Yes [provider]  meloxicam (MOBIC) 7.5 MG tablet Take 7.5 mg by mouth daily.   Yes [provider]  nortriptyline (PAMELOR) 10 MG capsule Take 10 mg by mouth at bedtime. 09/20/21  Yes [provider]  pantoprazole (PROTONIX) 40 MG tablet Take 40 mg by mouth daily. 08/03/21  Yes [provider]  pregabalin (LYRICA) 200 MG capsule Take 200 mg by mouth 2 (two) times daily. 09/13/21  Yes [provider]  QUEtiapine (SEROQUEL) 25 MG tablet Take 50 mg by mouth at bedtime. 09/20/21  Yes [provider]  thyroid (ARMOUR) 30 MG tablet Take 30 mg by mouth daily before breakfast. 03/26/18  Yes [provider]  venlafaxine XR (EFFEXOR-XR) 150 MG 24 hr capsule Take 150 mg by mouth daily. 09/04/21  Yes [provider]      VITAL SIGNS:  Blood pressure 119/76, pulse 75, temperature 97.8 F (36.6 C), temperature source Oral, resp. rate 16, height 5\' 6"  (1.676 m), weight 71.7 kg, SpO2 96 %.  PHYSICAL EXAMINATION:  Physical Exam  GENERAL:  61 y.o.-year-old Caucasian female patient lying in the bed with no acute distress.  EYES: Pupils equal, round, reactive to light and accommodation. No scleral icterus. Extraocular muscles intact.  HEENT: Head atraumatic, normocephalic. Oropharynx and nasopharynx clear.  NECK:  Supple, no jugular venous distention. No thyroid enlargement, no tenderness.  LUNGS: Normal breath sounds bilaterally, no wheezing, rales,rhonchi or crepitation. No use of accessory muscles of respiration.  CARDIOVASCULAR: Regular rate and rhythm, S1, S2 normal. No murmurs, rubs, or gallops.  ABDOMEN: Soft, nondistended, nontender. Bowel sounds present. No organomegaly or mass.  EXTREMITIES: No pedal edema, cyanosis, or clubbing.  NEUROLOGIC: Cranial nerves II through XII are intact. Muscle strength 5/5 in all extremities. Sensation intact. Gait not checked.  PSYCHIATRIC: The patient is alert and oriented x  3.  Normal affect and good eye contact. SKIN: No obvious rash, lesion, or ulcer.   LABORATORY PANEL:   CBC Recent Labs  Lab 10/06/21 0337  WBC 8.6  HGB 10.5*  HCT 31.9*  PLT 213   ------------------------------------------------------------------------------------------------------------------  Chemistries  Recent Labs  Lab 10/05/21 1909 10/06/21 0337  NA 138 141  K 3.4* 3.6  CL 106 113*  CO2 22 25  GLUCOSE 155* 128*  BUN 27* 19  CREATININE 1.27* 1.06*  CALCIUM 8.7* 8.4*  AST 28  --   ALT 19  --   ALKPHOS 96  --   BILITOT 0.5  --    ------------------------------------------------------------------------------------------------------------------  Cardiac Enzymes No results for input(s): TROPONINI in the last 168 hours. ------------------------------------------------------------------------------------------------------------------  RADIOLOGY:  DG Chest 2 View  Result Date: 10/06/2021 CLINICAL DATA:  Encounter for weakness. EXAM: CHEST - 2 VIEW COMPARISON:  PA Lat  01/03/2016. FINDINGS: The lungs again show mild emphysematous changes without evidence of focal infiltrate or pleural effusion. Heart size and vasculature are normal. There is osteopenia, mild thoracic kypholevoscoliosis, mild thoracic spondylosis. There are densely rim calcified breast implants also again noted. IMPRESSION: No evidence of acute chest disease.  Stable COPD chest. Electronically Signed   By: Telford Nab M.D.   On: 10/06/2021 00:01   CT HEAD WO CONTRAST  Result Date: 10/05/2021 CLINICAL DATA:  Mental status change of unknown cause.  Confusion. EXAM: CT HEAD WITHOUT CONTRAST TECHNIQUE: Contiguous axial images were obtained from the base of the skull through the vertex without intravenous contrast. RADIATION DOSE REDUCTION: This exam was performed according to the departmental dose-optimization program which includes automated exposure control, adjustment of the mA and/or kV according to patient  size and/or use of iterative reconstruction technique. COMPARISON:  None FINDINGS: Brain: The brain shows a normal appearance without evidence of malformation, atrophy, old or acute small or large vessel infarction, mass lesion, hemorrhage, hydrocephalus or extra-axial collection. Vascular: No hyperdense vessel. No evidence of atherosclerotic calcification. Skull: Normal.  No traumatic finding.  No focal bone lesion. Sinuses/Orbits: Sinuses are clear. Orbits appear normal. Mastoids are clear. Other: None significant IMPRESSION: Normal head CT. Electronically Signed   By: Nelson Chimes M.D.   On: 10/05/2021 19:38   US Venous Img Lower Bilateral (DVT)  Result Date: 10/04/2021 CLINICAL DATA:  Bilateral lower extremity pain and swelling EXAM: BILATERAL LOWER EXTREMITY VENOUS DOPPLER ULTRASOUND TECHNIQUE: Gray-scale sonography with graded compression, as well as color Doppler and duplex ultrasound were performed to evaluate the lower extremity deep venous systems from the level of the common femoral vein and including the common femoral, femoral, profunda femoral, popliteal and calf veins including the posterior tibial, peroneal and gastrocnemius veins when visible. The superficial great saphenous vein was also interrogated. Spectral Doppler was utilized to evaluate flow at rest and with distal augmentation maneuvers in the common femoral, femoral and popliteal veins. COMPARISON:  None Available. FINDINGS: RIGHT LOWER EXTREMITY Common Femoral Vein: No evidence of thrombus. Normal compressibility, respiratory phasicity and response to augmentation. Saphenofemoral Junction: No evidence of thrombus. Normal compressibility and flow on color Doppler imaging. Profunda Femoral Vein: No evidence of thrombus. Normal compressibility and flow on color Doppler imaging. Femoral Vein: No evidence of thrombus. Normal compressibility, respiratory phasicity and response to augmentation. Popliteal Vein: No evidence of thrombus. Normal  compressibility, respiratory phasicity and response to augmentation. Calf Veins: No evidence of thrombus. Normal compressibility and flow on color Doppler imaging. Superficial Great Saphenous Vein: No evidence of thrombus. Normal compressibility. Venous Reflux:  None. Other Findings:  None. LEFT LOWER EXTREMITY Common Femoral Vein: No evidence of thrombus. Normal compressibility, respiratory phasicity and response to augmentation. Saphenofemoral Junction: No evidence of thrombus. Normal compressibility and flow on color Doppler imaging. Profunda Femoral Vein: No evidence of thrombus. Normal compressibility and flow on color Doppler imaging. Femoral Vein: No evidence of thrombus. Normal compressibility, respiratory phasicity and response to augmentation. Popliteal Vein: No evidence of thrombus. Normal compressibility, respiratory phasicity and response to augmentation. Calf Veins: No evidence of thrombus. Normal compressibility and flow on color Doppler imaging. Superficial Great Saphenous Vein: No evidence of thrombus. Normal compressibility. Venous Reflux:  None. Other Findings:  None. IMPRESSION: No evidence of deep venous thrombosis in either lower extremity. Electronically Signed   By: Jacqulynn Cadet M.D.   On: 10/04/2021 15:27      IMPRESSION AND PLAN:  Assessment and Plan: * Sepsis due  to gram-negative UTI [facility]) -The patient will be admitted to a medical bed. - We will continue antibiotic therapy with IV Rocephin. - We will continue hydration with IV normal saline. - We will follow blood and urine cultures.  AKI (acute kidney injury) ([place]) - This is likely prerenal due to volume depletion and dehydration. - She will be hydrated with IV normal saline. - We will hold off nephrotoxins. - We will follow BMP.  Hypothyroidism - We will continue you Armour Thyroid.  GERD without esophagitis - We will continue PPI therapy.  Anxiety and depression - We will continue her Xanax and Abilify as  well as Effexor and Seroquel and hold off Pamelor.    DVT prophylaxis: Lovenox.  Advanced Care Planning:  Code Status: full code.  Family Communication:  The plan of care was discussed in details with the patient (and family). I answered all questions. The patient agreed to proceed with the above mentioned plan. Further management will depend upon hospital course. Disposition Plan: Back to previous home environment Consults called: none.  All the records are reviewed and case discussed with ED provider.  Status is: Inpatient   At the time of the admission, it appears that the appropriate admission status for this patient is inpatient.  This is judged to be reasonable and necessary in order to provide the required intensity of service to ensure the patient's safety given the presenting symptoms, physical exam findings and initial radiographic and laboratory data in the context of comorbid conditions.  The patient requires inpatient status due to high intensity of service, high risk of further deterioration and high frequency of surveillance required.  I certify that at the time of admission, it is my clinical judgment that the patient will require inpatient hospital care extending more than 2 midnights.                            Dispo: The patient is from: Home              Anticipated d/c is to: Home              Patient currently is not medically stable to d/c.              Difficult to place patient: No  Christel Mormon M.D on 10/06/2021 at 5:29 AM  [facility]   From 7 PM-7 AM, contact night-coverage [email or web address]  CC: Primary care physician; Derinda Late, MD

## 2021-10-06 NOTE — Assessment & Plan Note (Signed)
-   We will continue PPI therapy 

## 2021-10-06 NOTE — Assessment & Plan Note (Signed)
-   We will continue her Xanax and Abilify as well as Effexor and Seroquel and hold off Pamelor.

## 2021-10-06 NOTE — Hospital Course (Addendum)
Taken from H&P.  Eileen Novak is a 61 y.o. female with medical history significant for hypothyroidism, PCOS, depression and anxiety, presented to the ER with acute onset of altered mental status with confusion.  She stated that she was talking out of her had and she was feeling weak with pain all over her legs especially her knees and subsequent inability to walk.  She admits to nausea without vomiting or diarrhea.  She had low-grade fever 100.5.  No chest pain or dyspnea or cough or wheezing.  She admits to urinary frequency and urgency with dysuria without flank pain.   ED Course: When she came to the ER heart rate was 130 with temperature 38.1 and otherwise vital signs were within normal.  Labs revealed a creatinine 1.06 with BUN of 19 calcium of 8.4.  Procalcitonin was 4.39 and lactic acid 1.5  and later 0.9.  CBC showed anemia with normal PT and INR.   EKG as reviewed by me : She showed sinus tachycardia with a rate of 132 with Q waves laterally. Imaging: Two-view chest x-ray showed stable COPD.  Noncontrasted CT scan revealed no abnormalities.  The patient was given 2 g of IV Rocephin, 1 L bolus of 5 lactated Ringer and replacement for mild hypokalemia.  She is admitted to a medical bed for further evaluation and management.    6/2: Feeling little improved.  Cultures pending. Can be discharged tomorrow if remains stable on appropriate antibiotics.

## 2021-10-06 NOTE — Evaluation (Signed)
Physical Therapy Evaluation Patient Details Name: Eileen Novak MRN: [id number] DOB: 15-Dec-1960 Today's Date: 10/06/2021  History of Present Illness  Pt is a 61 yo that presented to the ED for AMS, weakness. Workup showed sepsis due to UTI, AKI  PMH of anxiety, depression, thyroid disease, chronic knee pain, GERD.   Clinical Impression  Pt alert, eager to mobilize to get to the bathroom, reported chronic leg pain L knee > R knee, and R foot but did not quantify during mobility. Per pt at baseline she is having more difficulty with ambulation, independent for ADLs, and lives with her husband who assists with IADLs. Pt still drives, does minimal cooking. Reported at least 2 falls in the last 6 months.  The patient was able to perform bed mobility modI. Sit <> stand from EOB and from standard commode. Reliant on BUE support to complete and effortful for pt, but no true physical assist needed, CGA with RW. She ambulated ~17ft without LOB, but did complain of L knee increasing pain and worry about buckling. 1-2 standing rest breaks and for pt to speak to PT, some decreased gait velocity and reliance on BUE support noted.  Overall the patient demonstrated deficits (see "PT Problem List") that impede the patient's functional abilities, safety, and mobility and would benefit from skilled PT intervention. Recommendation at this time is outpatient PT to decreases pt's risk of falls and to maximize function, ambulation, balance, and independence. Pt also given standing and seated exercise HEP.         Recommendations for follow up therapy are one component of a multi-disciplinary discharge planning process, led by the attending physician.  Recommendations may be updated based on patient status, additional functional criteria and insurance authorization.  Follow Up Recommendations Outpatient PT    Assistance Recommended at Discharge Intermittent Supervision/Assistance  Patient can return home with  the following  A little help with walking and/or transfers;Assistance with cooking/housework;Assist for transportation;Help with stairs or ramp for entrance    Equipment Recommendations Rolling walker (2 wheels)  Recommendations for Other Services       Functional Status Assessment Patient has had a recent decline in their functional status and demonstrates the ability to make significant improvements in function in a reasonable and predictable amount of time.     Precautions / Restrictions Precautions Precautions: Fall Restrictions Weight Bearing Restrictions: No      Mobility  Bed Mobility Overal bed mobility: Modified Independent                  Transfers Overall transfer level: Needs assistance Equipment used: Rolling walker (2 wheels) Transfers: Sit to/from Stand Sit to Stand: Supervision                Ambulation/Gait Ambulation/Gait assistance: Min guard, Supervision Gait Distance (Feet): 170 Feet Assistive device: Rolling walker (2 wheels)         General Gait Details: pt able to ambulate without knee buckling, no LOB  Stairs            Wheelchair Mobility    Modified Rankin (Stroke Patients Only)       Balance Overall balance assessment: Needs assistance Sitting-balance support: Feet supported Sitting balance-Leahy Scale: Good Sitting balance - Comments: pericare on commode independently     Standing balance-Leahy Scale: Good Standing balance comment: improved confidence with RW  Pertinent Vitals/Pain Pain Assessment Pain Assessment: Faces Faces Pain Scale: Hurts a little bit Pain Location: bilateral knees, L>R, R foot Pain Descriptors / Indicators: Aching, Sore, Grimacing Pain Intervention(s): Limited activity within patient's tolerance, Monitored during session, Repositioned    Home Living Family/patient expects to be discharged to:: Private residence Living Arrangements:  Spouse/significant other;Children Available Help at Discharge: Available 24 hours/day;Family Type of Home: House Home Access: Stairs to enter Entrance Stairs-Rails: None Entrance Stairs-Number of Steps: 2   Home Layout: Two level;Able to live on main level with bedroom/bathroom Home Equipment: None Additional Comments: pts mother has a rollator    Prior Function Prior Level of Function : Independent/Modified Independent             Mobility Comments: independent, but pt reported significant difficulty ambulating for the last 2 years ADLs Comments: independent for ADLs     Hand Dominance        Extremity/Trunk Assessment   Upper Extremity Assessment Upper Extremity Assessment: Overall WFL for tasks assessed    Lower Extremity Assessment Lower Extremity Assessment:  (able to move both LE against gravity)    Cervical / Trunk Assessment Cervical / Trunk Assessment: Normal  Communication   Communication: No difficulties  Cognition Arousal/Alertness: Awake/alert Behavior During Therapy: WFL for tasks assessed/performed Overall Cognitive Status: Within Functional Limits for tasks assessed                                          General Comments      Exercises     Assessment/Plan    PT Assessment Patient needs continued PT services  PT Problem List Decreased mobility;Decreased strength;Decreased activity tolerance;Decreased balance;Pain       PT Treatment Interventions DME instruction;Therapeutic exercise;Gait training;Balance training;Stair training;Neuromuscular re-education;Functional mobility training;Therapeutic activities;Patient/family education    PT Goals (Current goals can be found in the Care Plan section)  Acute Rehab PT Goals Patient Stated Goal: to be able to walk better PT Goal Formulation: With patient Time For Goal Achievement: 10/20/21 Potential to Achieve Goals: Good    Frequency Min 2X/week     Co-evaluation                AM-PAC PT "6 Clicks" Mobility  Outcome Measure Help needed turning from your back to your side while in a flat bed without using bedrails?: None Help needed moving from lying on your back to sitting on the side of a flat bed without using bedrails?: None Help needed moving to and from a bed to a chair (including a wheelchair)?: None Help needed standing up from a chair using your arms (e.g., wheelchair or bedside chair)?: None Help needed to walk in hospital room?: A Little Help needed climbing 3-5 steps with a railing? : A Little 6 Click Score: 22    End of Session Equipment Utilized During Treatment: Gait belt Activity Tolerance: Patient tolerated treatment well Patient left: in chair;with chair alarm set;with call bell/phone within reach Nurse Communication: Mobility status PT Visit Diagnosis: Other abnormalities of gait and mobility (R26.89);Difficulty in walking, not elsewhere classified (R26.2);Pain Pain - Right/Left: Left Pain - part of body: Knee    Time: [id number] PT Time Calculation (min) (ACUTE ONLY): 17 min   Charges:   PT Evaluation $PT Eval Low Complexity: 1 Low PT Treatments $Therapeutic Activity: 8-22 mins       Lieutenant Diego PT, DPT 10:50 AM,10/06/21

## 2021-10-06 NOTE — Assessment & Plan Note (Signed)
-   We will continue you Armour Thyroid.

## 2021-10-06 NOTE — Progress Notes (Addendum)
  Transition of Care (TOC) Screening Note   Patient Details  Name: Eileen Novak Date of Birth: 1960-06-15   Transition of Care [facility]) CM/SW Contact:    Marlowe Sax, RN Phone Number: 10/06/2021, 10:09 AM    The patient worked with PT and will benefit from a RW, [employer] will deliver to the bedside, they provided her with exercises to do at home, she reports to PT that her Ins does not cover Outpatient PT,

## 2021-10-06 NOTE — Progress Notes (Signed)
Met with the patient and her husband in the room  She lives at home with her husband and her adult son She will get a RW delivered to the room by [employer] She has an MRI scheduled for Tuesday, she is already seeing a neurologist for ongoing intermittent confusion outpatient Husband will pick up from hospital

## 2021-10-06 NOTE — Progress Notes (Signed)
Progress Note   Patient: Eileen Novak GXQ:[id number] DOB: 05-Nov-1960 DOA: 10/05/2021     0 DOS: the patient was seen and examined on 10/06/2021   Brief hospital course: Taken from H&P.  Eileen Novak is a 61 y.o. female with medical history significant for hypothyroidism, PCOS, depression and anxiety, presented to the ER with acute onset of altered mental status with confusion.  She stated that she was talking out of her had and she was feeling weak with pain all over her legs especially her knees and subsequent inability to walk.  She admits to nausea without vomiting or diarrhea.  She had low-grade fever 100.5.  No chest pain or dyspnea or cough or wheezing.  She admits to urinary frequency and urgency with dysuria without flank pain.   ED Course: When she came to the ER heart rate was 130 with temperature 38.1 and otherwise vital signs were within normal.  Labs revealed a creatinine 1.06 with BUN of 19 calcium of 8.4.  Procalcitonin was 4.39 and lactic acid 1.5  and later 0.9.  CBC showed anemia with normal PT and INR.   EKG as reviewed by me : She showed sinus tachycardia with a rate of 132 with Q waves laterally. Imaging: Two-view chest x-ray showed stable COPD.  Noncontrasted CT scan revealed no abnormalities.  The patient was given 2 g of IV Rocephin, 1 L bolus of 5 lactated Ringer and replacement for mild hypokalemia.  She is admitted to a medical bed for further evaluation and management.    6/2: Feeling little improved.  Cultures pending. Can be discharged tomorrow if remains stable on appropriate antibiotics.   Assessment and Plan: * Sepsis due to gram-negative UTI ([place]) Met sepsis criteria with fever, tachycardia.  Procalcitonin elevated at 4.39 .  Preliminary blood cultures negative in 12 hours.  Urine cultures pending, UA positive for nitrite and leukocytes. -Continue with ceftriaxone -Follow-up cultures -Continue supportive care  AKI (acute kidney injury)  ([place]) Creatinine started improving with IV hydration.  This is likely prerenal due to volume depletion and dehydration. - She will be hydrated with IV normal saline. - We will hold off nephrotoxins. - We will follow BMP.  Leg weakness - This is likely secondary to generalized weakness with her sepsis. - PT consult will be obtained.  Hypothyroidism - We will continue you Armour Thyroid.  GERD without esophagitis - We will continue PPI therapy.  Anxiety and depression - We will continue her Xanax and Abilify as well as Effexor and Seroquel and hold off Pamelor.   Subjective: Patient was feeling little improved when seen today.  Continues to have dysuria. She was afebrile at the time of exam  Physical Exam: Vitals:   10/06/21 0211 10/06/21 0540 10/06/21 0802 10/06/21 1213  BP: 119/76 115/80 128/82 100/74  Pulse: 75 79 80 87  Resp: 16 18    Temp: 97.8 F (36.6 C) 98.2 F (36.8 C)  98.1 F (36.7 C)  TempSrc: Oral Oral  Oral  SpO2: 96% 99% 97% 98%  Weight:      Height:       General.     In no acute distress. Pulmonary.  Lungs clear bilaterally, normal respiratory effort. CV.  Regular rate and rhythm, no JVD, rub or murmur. Abdomen.  Soft, nontender, nondistended, BS positive. CNS.  Alert and oriented .  No focal neurologic deficit. Extremities.  No edema, no cyanosis, pulses intact and symmetrical. Psychiatry.  Judgment and insight appears normal.  Data Reviewed: Prior  notes, labs and images reviewed  Family Communication: Discussed with husband at bedside  Disposition: Status is: Inpatient Remains inpatient appropriate because: Severity of illness   Planned Discharge Destination: Home  DVT prophylaxis.  Lovenox Time spent: 45 minutes  This record has been created using Systems analyst. Errors have been sought and corrected,but may not always be located. Such creation errors do not reflect on the standard of care.  Author: Lorella Nimrod,  MD 10/06/2021 1:14 PM  For on call review [email or web address].

## 2021-10-06 NOTE — Sepsis Progress Note (Signed)
Elink following Code Sepsis  Notified bedside nurse of need to draw lactic acid & blood cultures.

## 2021-10-07 ENCOUNTER — Other Ambulatory Visit: Payer: BC Managed Care – PPO

## 2021-10-07 NOTE — Progress Notes (Signed)
Progress Note   Patient: Eileen Novak CWC:[id number] DOB: 1960/06/07 DOA: 10/05/2021     1 DOS: the patient was seen and examined on 10/07/2021   Brief hospital course: Taken from H&P.  Eileen Novak is a 61 y.o. female with medical history significant for hypothyroidism, PCOS, depression and anxiety, presented to the ER with acute onset of altered mental status with confusion.  She stated that she was talking out of her had and she was feeling weak with pain all over her legs especially her knees and subsequent inability to walk.  She admits to nausea without vomiting or diarrhea.  She had low-grade fever 100.5.  No chest pain or dyspnea or cough or wheezing.  She admits to urinary frequency and urgency with dysuria without flank pain.   ED Course: When she came to the ER heart rate was 130 with temperature 38.1 and otherwise vital signs were within normal.  Labs revealed a creatinine 1.06 with BUN of 19 calcium of 8.4.  Procalcitonin was 4.39 and lactic acid 1.5  and later 0.9.  CBC showed anemia with normal PT and INR.   EKG as reviewed by me : She showed sinus tachycardia with a rate of 132 with Q waves laterally. Imaging: Two-view chest x-ray showed stable COPD.  Noncontrasted CT scan revealed no abnormalities.  The patient was given 2 g of IV Rocephin, 1 L bolus of 5 lactated Ringer and replacement for mild hypokalemia.  She is admitted to a medical bed for further evaluation and management.    6/2: Feeling little improved.  Cultures pending. Can be discharged tomorrow if remains stable on appropriate antibiotics.  Awaiting urine culture results, she is slightly better today, LGF overnight. Still somewhat "fuzzy" feeling but no syncope nor dizziness. D/w family members.   Assessment and Plan: * Sepsis due to gram-negative UTI ([place]) Met sepsis criteria with fever, tachycardia.  Procalcitonin elevated at 4.39 .  Preliminary blood cultures negative in 12 hours.  Urine  cultures pending, UA positive for nitrite and leukocytes. -Continue with ceftriaxone -Follow-up cultures -Continue supportive care  AKI (acute kidney injury) ([place]) Creatinine started improving with IV hydration.  This is likely prerenal due to volume depletion and dehydration. - She will be hydrated with IV normal saline. - We will hold off nephrotoxins. - We will follow BMP.  Leg weakness - This is likely secondary to generalized weakness with her sepsis. - PT consult will be obtained.  Hypothyroidism - We will continue you Armour Thyroid.  GERD without esophagitis - We will continue PPI therapy.  Anxiety and depression - We will continue her Xanax and Abilify as well as Effexor and Seroquel and hold off Pamelor.   Subjective: Afebrile, VSS. LGF NAD  Physical Exam: Vitals:   10/06/21 1213 10/06/21 1958 10/07/21 0429 10/07/21 0740  BP: 100/74 (!) 149/92 139/88 124/86  Pulse: 87 99 86 80  Resp:  '18 17 16  ' Temp: 98.1 F (36.7 C) 99 F (37.2 C) 98.5 F (36.9 C) 98.6 F (37 C)  TempSrc: Oral     SpO2: 98% 100% 94% 96%  Weight:      Height:       General.     In no acute distress. Pulmonary.  Lungs clear bilaterally, normal respiratory effort. CV.  Regular rate and rhythm, no JVD, rub or murmur. Abdomen.  Soft, nontender, nondistended, BS positive. CNS.  Alert and oriented .  No focal neurologic deficit. Extremities.  No edema, no cyanosis, pulses intact and symmetrical.  Psychiatry.  Judgment and insight appears normal.  Data Reviewed: Prior notes, labs and images reviewed  Family Communication: Discussed with husband at bedside  Disposition: Status is: Inpatient Remains inpatient appropriate because: Severity of illness   Planned Discharge Destination: Home  DVT prophylaxis.  Lovenox Time spent: 45 minutes  This record has been created using Systems analyst. Errors have been sought and corrected,but may not always be located. Such  creation errors do not reflect on the standard of care.  Author: Vanna Scotland, MD 10/07/2021 10:39 AM  For on call review [email or web address].

## 2021-10-08 ENCOUNTER — Other Ambulatory Visit: Payer: Self-pay | Admitting: General Practice

## 2021-10-08 LAB — URINE CULTURE: Culture: >=100000 — AB

## 2021-10-08 MED ORDER — ACETAMINOPHEN 325 MG PO TABS
650.0000 mg | ORAL_TABLET | ORAL | Status: DC | PRN
Start: 2021-10-08 — End: 2021-10-08
  Administered 2021-10-08: 650 mg via ORAL
  Filled 2021-10-08: qty 2

## 2021-10-08 MED ORDER — ACETAMINOPHEN 325 MG RE SUPP: 650.0000 mg | Freq: Four times a day (QID) | RECTAL | Status: DC | PRN

## 2021-10-08 MED ORDER — SULFAMETHOXAZOLE-TRIMETHOPRIM 800-160 MG PO TABS: 1.0000 | ORAL_TABLET | Freq: Two times a day (BID) | ORAL | 0 refills | Status: AC

## 2021-10-08 MED ORDER — SULFAMETHOXAZOLE-TRIMETHOPRIM 800-160 MG PO TABS: 1.0000 | ORAL_TABLET | Freq: Two times a day (BID) | ORAL | 0 refills | Status: DC

## 2021-10-08 NOTE — Progress Notes (Signed)
Patient requested Rx to be sent to [facility] pharmacy, will resend again to [facility] per patient request.

## 2021-10-08 NOTE — Progress Notes (Deleted)
{  Select_TRH_Note:[id number]} 

## 2021-10-08 NOTE — Discharge Summary (Signed)
Physician Discharge Summary   Patient: Eileen Novak MRN: [id number] DOB: Dec 17, 1960  Admit date:     10/05/2021  Discharge date: 10/08/21  Discharge Physician: Vanna Scotland   PCP: Derinda Late, MD    Repeat BMP at PCP visit  Discharge Diagnoses: Principal Problem:   Sepsis due to gram-negative UTI [facility]) Active Problems:   AKI (acute kidney injury) ([place])   Anxiety and depression   GERD without esophagitis   Hypothyroidism   Leg weakness  Resolved Problems:   * No resolved hospital problems. [facility] Course: Taken from H&P.  Eileen Novak is a 61 y.o. female with medical history significant for hypothyroidism, PCOS, depression and anxiety, presented to the ER with acute onset of altered mental status with confusion.  She stated that she was talking out of her had and she was feeling weak with pain all over her legs especially her knees and subsequent inability to walk.  She admits to nausea without vomiting or diarrhea.  She had low-grade fever 100.5.  No chest pain or dyspnea or cough or wheezing.  She admits to urinary frequency and urgency with dysuria without flank pain.   ED Course: When she came to the ER heart rate was 130 with temperature 38.1 and otherwise vital signs were within normal.  Labs revealed a creatinine 1.06 with BUN of 19 calcium of 8.4.  Procalcitonin was 4.39 and lactic acid 1.5  and later 0.9.  CBC showed anemia with normal PT and INR.   EKG as reviewed by me : She showed sinus tachycardia with a rate of 132 with Q waves laterally. Imaging: Two-view chest x-ray showed stable COPD.  Noncontrasted CT scan revealed no abnormalities.  The patient was given 2 g of IV Rocephin, 1 L bolus of 5 lactated Ringer and replacement for mild hypokalemia.  She is admitted to a medical bed for further evaluation and management.    6/2: Feeling little improved.  Cultures pending. Can be discharged tomorrow if remains stable on appropriate  antibiotics.  Assessment and Plan: * Sepsis due to gram-negative UTI ([place]) Met sepsis criteria with fever, tachycardia.  Procalcitonin elevated at 4.39 .  Preliminary blood cultures negative in 12 hours.  Urine cultures pending, UA positive for nitrite and leukocytes. RESOLVED Ceftriaxone 6/2-6/4, transition to bactrim Urine Culture sensitivities reviewed, pan susceptible - hydration is key F/u PCP  AKI (acute kidney injury) ([place]) Creatinine started improving with IV hydration.  This is likely prerenal due to volume depletion and dehydration.  -- We will hold off nephrotoxins.   Leg weakness - This is likely secondary to generalized weakness with her sepsis. - PT consult will be obtained.  Hypothyroidism - continue Armour Thyroid.  GERD without esophagitis - continue PPI therapy.  Anxiety and depression - Hold xanax if not taking regularly, and allow for cognitive improvement. -- Abilify as well as Effexor and Seroquel and hold off Pamelor.        Pain control - [employer] Controlled Substance Reporting System database was reviewed. and patient was instructed, not to drive, operate heavy machinery, perform activities at heights, swimming or participation in water activities or provide baby-sitting services while on Pain, Sleep and Anxiety Medications; until their outpatient Physician has advised to do so again. Also recommended to not to take more than prescribed Pain, Sleep and Anxiety Medications.      Diet recommendation:  Discharge Diet Orders (From admission, onward)     Start     Ordered   10/08/21  0000  Diet - low sodium heart healthy        10/08/21 8657           Regular diet DISCHARGE MEDICATION: Allergies as of 10/08/2021       Reactions   Escitalopram Oxalate    REACTION: nausea, decreased libito   Penicillins    REACTION: as child        Medication List     STOP taking these medications    ALPRAZolam 0.25 MG tablet Commonly known as:  XANAX   meloxicam 7.5 MG tablet Commonly known as: MOBIC       TAKE these medications    ARIPiprazole 5 MG tablet Commonly known as: ABILIFY Take 5 mg by mouth at bedtime.   calcium carbonate 500 MG chewable tablet Commonly known as: TUMS - dosed in mg elemental calcium Chew 1 tablet by mouth as needed for indigestion or heartburn.   cyclobenzaprine 5 MG tablet Commonly known as: FLEXERIL Take 5 mg by mouth at bedtime as needed.   famotidine 40 MG tablet Commonly known as: PEPCID Take 40 mg by mouth at bedtime.   nortriptyline 10 MG capsule Commonly known as: PAMELOR Take 10 mg by mouth at bedtime.   pantoprazole 40 MG tablet Commonly known as: PROTONIX Take 40 mg by mouth daily.   pregabalin 200 MG capsule Commonly known as: LYRICA Take 200 mg by mouth 2 (two) times daily.   QUEtiapine 25 MG tablet Commonly known as: SEROQUEL Take 50 mg by mouth at bedtime.   sulfamethoxazole-trimethoprim 800-160 MG tablet Commonly known as: BACTRIM DS Take 1 tablet by mouth 2 (two) times daily for 5 days.   thyroid 30 MG tablet Commonly known as: ARMOUR Take 30 mg by mouth daily before breakfast.   venlafaxine XR 150 MG 24 hr capsule Commonly known as: EFFEXOR-XR Take 150 mg by mouth daily.               Durable Medical Equipment  (From admission, onward)           Start     Ordered   10/06/21 1015  For home use only DME Walker rolling  Once       Question Answer Comment  Walker: With 5 Inch Wheels   Patient needs a walker to treat with the following condition Difficulty walking      10/06/21 1014            Discharge Exam: Filed Weights   10/05/21 1901  Weight: 71.7 kg               General.     In no acute distress. Pulmonary.  Lungs clear bilaterally, normal respiratory effort. CV.  Regular rate and rhythm, no JVD, rub or murmur. Abdomen.  Soft, nontender, nondistended, BS positive. CNS.  Alert and oriented .  No focal neurologic  deficit. Extremities.  No edema, no cyanosis, pulses intact and symmetrical. Psychiatry.  Judgment and insight appears normal.  Condition at discharge: good  The results of significant diagnostics from this hospitalization (including imaging, microbiology, ancillary and laboratory) are listed below for reference.   Imaging Studies: DG Chest 2 View  Result Date: 10/06/2021 CLINICAL DATA:  Encounter for weakness. EXAM: CHEST - 2 VIEW COMPARISON:  PA Lat 01/03/2016. FINDINGS: The lungs again show mild emphysematous changes without evidence of focal infiltrate or pleural effusion. Heart size and vasculature are normal. There is osteopenia, mild thoracic kypholevoscoliosis, mild thoracic spondylosis. There are densely rim calcified breast  implants also again noted. IMPRESSION: No evidence of acute chest disease.  Stable COPD chest. Electronically Signed   By: Telford Nab M.D.   On: 10/06/2021 00:01   CT HEAD WO CONTRAST  Result Date: 10/05/2021 CLINICAL DATA:  Mental status change of unknown cause.  Confusion. EXAM: CT HEAD WITHOUT CONTRAST TECHNIQUE: Contiguous axial images were obtained from the base of the skull through the vertex without intravenous contrast. RADIATION DOSE REDUCTION: This exam was performed according to the departmental dose-optimization program which includes automated exposure control, adjustment of the mA and/or kV according to patient size and/or use of iterative reconstruction technique. COMPARISON:  None FINDINGS: Brain: The brain shows a normal appearance without evidence of malformation, atrophy, old or acute small or large vessel infarction, mass lesion, hemorrhage, hydrocephalus or extra-axial collection. Vascular: No hyperdense vessel. No evidence of atherosclerotic calcification. Skull: Normal.  No traumatic finding.  No focal bone lesion. Sinuses/Orbits: Sinuses are clear. Orbits appear normal. Mastoids are clear. Other: None significant IMPRESSION: Normal head CT.  Electronically Signed   By: Nelson Chimes M.D.   On: 10/05/2021 19:38   US Venous Img Lower Bilateral (DVT)  Result Date: 10/04/2021 CLINICAL DATA:  Bilateral lower extremity pain and swelling EXAM: BILATERAL LOWER EXTREMITY VENOUS DOPPLER ULTRASOUND TECHNIQUE: Gray-scale sonography with graded compression, as well as color Doppler and duplex ultrasound were performed to evaluate the lower extremity deep venous systems from the level of the common femoral vein and including the common femoral, femoral, profunda femoral, popliteal and calf veins including the posterior tibial, peroneal and gastrocnemius veins when visible. The superficial great saphenous vein was also interrogated. Spectral Doppler was utilized to evaluate flow at rest and with distal augmentation maneuvers in the common femoral, femoral and popliteal veins. COMPARISON:  None Available. FINDINGS: RIGHT LOWER EXTREMITY Common Femoral Vein: No evidence of thrombus. Normal compressibility, respiratory phasicity and response to augmentation. Saphenofemoral Junction: No evidence of thrombus. Normal compressibility and flow on color Doppler imaging. Profunda Femoral Vein: No evidence of thrombus. Normal compressibility and flow on color Doppler imaging. Femoral Vein: No evidence of thrombus. Normal compressibility, respiratory phasicity and response to augmentation. Popliteal Vein: No evidence of thrombus. Normal compressibility, respiratory phasicity and response to augmentation. Calf Veins: No evidence of thrombus. Normal compressibility and flow on color Doppler imaging. Superficial Great Saphenous Vein: No evidence of thrombus. Normal compressibility. Venous Reflux:  None. Other Findings:  None. LEFT LOWER EXTREMITY Common Femoral Vein: No evidence of thrombus. Normal compressibility, respiratory phasicity and response to augmentation. Saphenofemoral Junction: No evidence of thrombus. Normal compressibility and flow on color Doppler imaging.  Profunda Femoral Vein: No evidence of thrombus. Normal compressibility and flow on color Doppler imaging. Femoral Vein: No evidence of thrombus. Normal compressibility, respiratory phasicity and response to augmentation. Popliteal Vein: No evidence of thrombus. Normal compressibility, respiratory phasicity and response to augmentation. Calf Veins: No evidence of thrombus. Normal compressibility and flow on color Doppler imaging. Superficial Great Saphenous Vein: No evidence of thrombus. Normal compressibility. Venous Reflux:  None. Other Findings:  None. IMPRESSION: No evidence of deep venous thrombosis in either lower extremity. Electronically Signed   By: Jacqulynn Cadet M.D.   On: 10/04/2021 15:27    Microbiology: Results for orders placed or performed during the hospital encounter of 10/05/21  Resp Panel by RT-PCR (Flu A&B, Covid) Anterior Nasal Swab     Status: None   Collection Time: 10/05/21  7:10 PM   Specimen: Anterior Nasal Swab  Result Value Ref Range Status  SARS Coronavirus 2 by RT PCR NEGATIVE NEGATIVE Final    Comment: (NOTE) SARS-CoV-2 target nucleic acids are NOT DETECTED.  The SARS-CoV-2 RNA is generally detectable in upper respiratory specimens during the acute phase of infection. The lowest concentration of SARS-CoV-2 viral copies this assay can detect is 138 copies/mL. A negative result does not preclude SARS-Cov-2 infection and should not be used as the sole basis for treatment or other patient management decisions. A negative result may occur with  improper specimen collection/handling, submission of specimen other than nasopharyngeal swab, presence of viral mutation(s) within the areas targeted by this assay, and inadequate number of viral copies(<138 copies/mL). A negative result must be combined with clinical observations, patient history, and epidemiological information. The expected result is Negative.  Fact Sheet for Patients:   EntrepreneurPulse.com.au  Fact Sheet for Healthcare Providers:  IncredibleEmployment.be  This test is no t yet approved or cleared by the [place] FDA and  has been authorized for detection and/or diagnosis of SARS-CoV-2 by FDA under an Emergency Use Authorization (EUA). This EUA will remain  in effect (meaning this test can be used) for the duration of the COVID-19 declaration under Section 564(b)(1) of the Act, 21 U.S.C.section 360bbb-3(b)(1), unless the authorization is terminated  or revoked sooner.       Influenza A by PCR NEGATIVE NEGATIVE Final   Influenza B by PCR NEGATIVE NEGATIVE Final    Comment: (NOTE) The Xpert Xpress SARS-CoV-2/FLU/RSV plus assay is intended as an aid in the diagnosis of influenza from Nasopharyngeal swab specimens and should not be used as a sole basis for treatment. Nasal washings and aspirates are unacceptable for Xpert Xpress SARS-CoV-2/FLU/RSV testing.  Fact Sheet for Patients: EntrepreneurPulse.com.au  Fact Sheet for Healthcare Providers: IncredibleEmployment.be  This test is not yet approved or cleared by the [place] FDA and has been authorized for detection and/or diagnosis of SARS-CoV-2 by FDA under an Emergency Use Authorization (EUA). This EUA will remain in effect (meaning this test can be used) for the duration of the COVID-19 declaration under Section 564(b)(1) of the Act, 21 U.S.C. section 360bbb-3(b)(1), unless the authorization is terminated or revoked.  Performed at [facility], [address] [place], [place] [address]   Urine Culture     Status: Abnormal   Collection Time: 10/05/21  7:10 PM   Specimen: Urine, Random  Result Value Ref Range Status   Specimen Description   Final    URINE, RANDOM Performed at [facility], [address] [place], [place] [address]    Special Requests   Final    NONE Performed at  [facility], [place], [place] [address]    Culture >=100,000 COLONIES/mL ESCHERICHIA COLI (A)  Final   Report Status 10/08/2021 FINAL  Final   Organism ID, Bacteria ESCHERICHIA COLI (A)  Final      Susceptibility   Escherichia coli - MIC*    AMPICILLIN 4 SENSITIVE Sensitive     CEFAZOLIN <=4 SENSITIVE Sensitive     CEFEPIME <=0.12 SENSITIVE Sensitive     CEFTRIAXONE <=0.25 SENSITIVE Sensitive     CIPROFLOXACIN <=0.25 SENSITIVE Sensitive     GENTAMICIN <=1 SENSITIVE Sensitive     IMIPENEM <=0.25 SENSITIVE Sensitive     NITROFURANTOIN <=16 SENSITIVE Sensitive     TRIMETH/SULFA <=20 SENSITIVE Sensitive     AMPICILLIN/SULBACTAM <=2 SENSITIVE Sensitive     PIP/TAZO <=4 SENSITIVE Sensitive     * >=100,000 COLONIES/mL ESCHERICHIA COLI  Culture, blood (routine  x 2)     Status: None (Preliminary result)   Collection Time: 10/06/21 12:10 AM   Specimen: BLOOD  Result Value Ref Range Status   Specimen Description BLOOD RIGHT ASSIST CONTROL  Final   Special Requests   Final    BOTTLES DRAWN AEROBIC AND ANAEROBIC Blood Culture adequate volume   Culture   Final    NO GROWTH 2 DAYS Performed at [facility], [address] [place], [place] [address]    Report Status PENDING  Incomplete  Culture, blood (routine x 2)     Status: None (Preliminary result)   Collection Time: 10/06/21 12:25 AM   Specimen: BLOOD  Result Value Ref Range Status   Specimen Description BLOOD RIGHT HAND  Final   Special Requests   Final    BOTTLES DRAWN AEROBIC AND ANAEROBIC Blood Culture adequate volume   Culture   Final    NO GROWTH 2 DAYS Performed at [facility], [address] [place], [place] [address]    Report Status PENDING  Incomplete    Labs: CBC: Recent Labs  Lab 10/05/21 1909 10/06/21 0337  WBC 7.2 8.6  NEUTROABS 6.1  --   HGB 12.2 10.5*  HCT 37.3 31.9*  MCV 95.9 96.4  PLT 230 098   Basic Metabolic Panel: Recent Labs  Lab 10/05/21 1909  10/06/21 0337  NA 138 141  K 3.4* 3.6  CL 106 113*  CO2 22 25  GLUCOSE 155* 128*  BUN 27* 19  CREATININE 1.27* 1.06*  CALCIUM 8.7* 8.4*   Liver Function Tests: Recent Labs  Lab 10/05/21 1909  AST 28  ALT 19  ALKPHOS 96  BILITOT 0.5  PROT 6.9  ALBUMIN 3.8   CBG: No results for input(s): GLUCAP in the last 168 hours.  Discharge time spent: less than 30 minutes.  Signed: Vanna Scotland, MD [facility] 10/08/2021

## 2021-10-08 NOTE — Progress Notes (Signed)
Patient was given verbal and written discharge instructions, she stated she will comply,taken to car by wheelchair, no distress when leaving the floor .

## 2021-10-10 ENCOUNTER — Ambulatory Visit
Admission: RE | Admit: 2021-10-10 | Discharge: 2021-10-10 | Disposition: A | Discharge status: Home / Self Care | Payer: BC Managed Care – PPO | Source: Ambulatory Visit | Attending: Neurology | Admitting: Neurology

## 2021-10-10 DIAGNOSIS — R519 Headache, unspecified: Secondary | ICD-10-CM | POA: Insufficient documentation

## 2021-10-10 DIAGNOSIS — H532 Diplopia: Secondary | ICD-10-CM | POA: Insufficient documentation

## 2021-10-11 LAB — CULTURE, BLOOD (ROUTINE X 2)
Culture: NO GROWTH
Culture: NO GROWTH
Special Requests: ADEQUATE
Special Requests: ADEQUATE

## 2021-10-26 ENCOUNTER — Other Ambulatory Visit (HOSPITAL_COMMUNITY)
Admission: RE | Admit: 2021-10-26 | Discharge: 2021-10-26 | Disposition: A | Discharge status: Home / Self Care | Payer: BC Managed Care – PPO | Source: Ambulatory Visit | Attending: Obstetrics | Admitting: Obstetrics

## 2021-10-26 ENCOUNTER — Encounter: Payer: Self-pay | Admitting: Obstetrics

## 2021-10-26 ENCOUNTER — Ambulatory Visit (INDEPENDENT_AMBULATORY_CARE_PROVIDER_SITE_OTHER): Payer: BC Managed Care – PPO | Admitting: Obstetrics

## 2021-10-26 VITALS — BP 113/79 | HR 91 | Ht 66.0 in | Wt 158.1 lb

## 2021-10-26 DIAGNOSIS — Z01419 Encounter for gynecological examination (general) (routine) without abnormal findings: Secondary | ICD-10-CM

## 2021-10-26 DIAGNOSIS — Z1231 Encounter for screening mammogram for malignant neoplasm of breast: Secondary | ICD-10-CM | POA: Diagnosis not present

## 2021-10-26 DIAGNOSIS — Z124 Encounter for screening for malignant neoplasm of cervix: Secondary | ICD-10-CM | POA: Insufficient documentation

## 2021-10-26 NOTE — Progress Notes (Signed)
SUBJECTIVE  HPI  Eileen Novak is a 61 y.o.-year-old female who presents for an annual gynecological exam and Pap smear today. She denies pelvic pain, abnormal bleeding or discharge, and UTI symptoms. She reports urge incontinence and occasional dyspareunia. The dyspareunia is improved with lubrication. She went through menopause approximately 8 years ago and has no vasomotor symptoms. She would like to discuss weight loss. She has no other gynecologic health concerns today. The rest of her health concerns are managed by her PCP.   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 Former smoker. Occasional EtOH. Denies recreational drugs.  Obstetric History OB History     Gravida  3   Para  2   Term  2   Preterm      AB  1   Living  2      SAB  0   IAB  1   Ectopic      Multiple      Live Births  2            GYN/Menstrual History No LMP recorded. Patient is postmenopausal. Last Pap: ~10 years ago (unsure) Contraception: post-menopausal  Prevention Endorses regular eye and dental exams Mammogram: 2 years ago Colonoscopy: >10 years ago. Declines. Will consider Cologuard  Current Medications Outpatient Medications Prior to Visit  Medication Sig   buPROPion (WELLBUTRIN XL) 150 MG 24 hr tablet Take by mouth.   buPROPion (WELLBUTRIN XL) 300 MG 24 hr tablet Take 300 mg by mouth daily.   calcium carbonate (TUMS - DOSED IN MG ELEMENTAL CALCIUM) 500 MG chewable tablet Chew 1 tablet by mouth as needed  for indigestion or heartburn.   cyclobenzaprine (FLEXERIL) 5 MG tablet Take 5 mg by mouth at bedtime as needed.   famotidine (PEPCID) 40 MG tablet Take 40 mg by mouth at bedtime.   nortriptyline (PAMELOR) 10 MG capsule Take 10 mg by mouth at bedtime.   pantoprazole (PROTONIX) 40 MG tablet Take 40 mg by mouth daily.   pregabalin (LYRICA) 200 MG capsule Take 200 mg by mouth 2 (two) times daily.   QUEtiapine (SEROQUEL) 25 MG tablet Take 50 mg by mouth at bedtime.   REXULTI 0.5 MG TABS Take 1 tablet by mouth daily.   thyroid (ARMOUR) 30 MG tablet Take 30 mg by mouth daily before breakfast.   venlafaxine XR (EFFEXOR-XR) 150 MG 24 hr capsule Take 150 mg by mouth daily.   [DISCONTINUED] ARIPiprazole (ABILIFY) 5 MG tablet Take 5 mg by mouth at bedtime. (Patient not taking: Reported on 10/06/2021)   No facility-administered medications prior to visit.      ROS History obtained from the patient General ROS: negative for - chills, fatigue, or malaise Psychological ROS: positive for - anxiety and depression Ophthalmic ROS: negative for - blurry vision ENT ROS: positive for - headaches and occasional difficulty swallowing negative for - sore throat or visual changes Hematological and Lymphatic ROS: negative Breast ROS:  negative for breast lumps Respiratory ROS: no cough, shortness of breath, or wheezing Cardiovascular ROS: no chest pain or dyspnea on exertion Gastrointestinal ROS: no abdominal pain, change in bowel habits, or black or bloody stools Genito-Urinary ROS: no dysuria, trouble voiding, or hematuria positive for - urge incontinence Musculoskeletal ROS: positive for - gait disturbance and muscle pain Dermatological ROS: negative     01/28/2018    3:38 PM 05/10/2017    4:15 PM 01/02/2017   10:56 AM 12/07/2016    3:56 PM 07/26/2016    2:50 PM  Depression screen PHQ 2/9  Decreased Interest 0 0 0 0 0  Down, Depressed, Hopeless 0 0 0 0 0  PHQ - 2 Score 0 0 0 0 0  Altered sleeping 0       Tired, decreased energy 0      Change in appetite 0      Feeling bad or failure about yourself  0      Trouble concentrating 0      Moving slowly or fidgety/restless 0      Suicidal thoughts 0      PHQ-9 Score 0      Difficult doing work/chores Not difficult at all         OBJECTIVE  Last Weight  Most recent update: 10/26/2021  1:33 PM    Weight  71.7 kg (158 lb 1.6 oz)             Body mass index is 25.52 kg/m.    BP 113/79   Pulse 91   Ht 5\' 6"  (1.676 m)   Wt 158 lb 1.6 oz (71.7 kg)   BMI 25.52 kg/m  General appearance: alert, cooperative, and appears stated age Head: Normocephalic, without obvious abnormality, atraumatic Eyes: negative findings: lids and lashes normal and conjunctivae and sclerae normal Neck: no adenopathy, supple, symmetrical, trachea midline, and thyroid not enlarged, symmetric, no tenderness/mass/nodules Lungs: clear to auscultation bilaterally Breasts: normal appearance, no masses or tenderness, Inspection negative, No axillary or supraclavicular adenopathy, Normal to palpation without dominant masses Heart: regular rate and rhythm, S1, S2 normal, no murmur, click, rub or gallop Abdomen: soft, non-tender; bowel sounds normal; no masses,  no organomegaly Pelvic: cervix normal in appearance, external genitalia normal, no adnexal masses or tenderness, no cervical motion tenderness, rectovaginal septum normal, uterus normal size, shape, and consistency, vagina normal without discharge, and Pap collected. Contact bleeding noted. Extremities: extremities normal, atraumatic, no cyanosis or edema Pulses: 2+ and symmetric Skin: Skin color, texture, turgor normal. No rashes or lesions Lymph nodes: Cervical, supraclavicular, and axillary nodes normal.  ASSESSMENT  1) Annual exam 2) Pap due  PLAN 1) Physical exam as noted. Discussed healthy lifestyle choices. Reviewed weight management, low-impact exercise. Mammogram ordered. Declines pelvic PT at this  time. Declines STI screening and routine labs. 2) Pap collected. F/u based on results.  Return in one year for annual exam or as needed for concerns.   Guadlupe Spanish, CNM

## 2021-11-01 ENCOUNTER — Encounter: Payer: Self-pay | Admitting: Obstetrics

## 2021-11-01 LAB — CYTOLOGY - PAP
Comment: NEGATIVE
Diagnosis: NEGATIVE
High risk HPV: NEGATIVE

## 2021-11-03 ENCOUNTER — Telehealth: Payer: Self-pay | Admitting: Emergency Medicine

## 2021-11-03 ENCOUNTER — Other Ambulatory Visit: Payer: Self-pay

## 2021-11-03 ENCOUNTER — Emergency Department
Admission: EM | Admit: 2021-11-03 | Discharge: 2021-11-03 | Disposition: A | Discharge status: Home / Self Care | Payer: BC Managed Care – PPO | Attending: Emergency Medicine | Admitting: Emergency Medicine

## 2021-11-03 DIAGNOSIS — R5383 Other fatigue: Secondary | ICD-10-CM | POA: Insufficient documentation

## 2021-11-03 DIAGNOSIS — D649 Anemia, unspecified: Secondary | ICD-10-CM | POA: Insufficient documentation

## 2021-11-03 DIAGNOSIS — R944 Abnormal results of kidney function studies: Secondary | ICD-10-CM | POA: Insufficient documentation

## 2021-11-03 DIAGNOSIS — Z0000 Encounter for general adult medical examination without abnormal findings: Secondary | ICD-10-CM | POA: Diagnosis not present

## 2021-11-03 DIAGNOSIS — E039 Hypothyroidism, unspecified: Secondary | ICD-10-CM | POA: Insufficient documentation

## 2021-11-03 DIAGNOSIS — Z139 Encounter for screening, unspecified: Secondary | ICD-10-CM

## 2021-11-03 DIAGNOSIS — R410 Disorientation, unspecified: Secondary | ICD-10-CM | POA: Insufficient documentation

## 2021-11-03 DIAGNOSIS — M7989 Other specified soft tissue disorders: Secondary | ICD-10-CM | POA: Insufficient documentation

## 2021-11-03 LAB — URINE DRUG SCREEN, QUALITATIVE (ARMC ONLY)
Amphetamines, Ur Screen: NOT DETECTED
Barbiturates, Ur Screen: NOT DETECTED
Benzodiazepine, Ur Scrn: NOT DETECTED
Cannabinoid 50 Ng, Ur ~~LOC~~: NOT DETECTED
Cocaine Metabolite,Ur ~~LOC~~: NOT DETECTED
MDMA (Ecstasy)Ur Screen: NOT DETECTED
Methadone Scn, Ur: NOT DETECTED
Opiate, Ur Screen: NOT DETECTED
Phencyclidine (PCP) Ur S: NOT DETECTED
Tricyclic, Ur Screen: POSITIVE — AB

## 2021-11-03 LAB — URINALYSIS, ROUTINE W REFLEX MICROSCOPIC
Bacteria, UA: NONE SEEN
Bilirubin Urine: NEGATIVE
Glucose, UA: NEGATIVE mg/dL
Hgb urine dipstick: NEGATIVE
Ketones, ur: NEGATIVE mg/dL
Nitrite: NEGATIVE
Protein, ur: NEGATIVE mg/dL
Specific Gravity, Urine: 1.009 (ref 1.005–1.030)
Squamous Epithelial / HPF: NONE SEEN (ref 0–5)
pH: 6 (ref 5.0–8.0)

## 2021-11-03 LAB — TSH: TSH: 10.342 u[IU]/mL — ABNORMAL HIGH (ref 0.350–4.500)

## 2021-11-03 LAB — CBC
HCT: 35 % — ABNORMAL LOW (ref 36.0–46.0)
Hemoglobin: 11.2 g/dL — ABNORMAL LOW (ref 12.0–15.0)
MCH: 31.5 pg (ref 26.0–34.0)
MCHC: 32 g/dL (ref 30.0–36.0)
MCV: 98.6 fL (ref 80.0–100.0)
Platelets: 229 10*3/uL (ref 150–400)
RBC: 3.55 MIL/uL — ABNORMAL LOW (ref 3.87–5.11)
RDW: 13.9 % (ref 11.5–15.5)
WBC: 5.8 10*3/uL (ref 4.0–10.5)
nRBC: 0 % (ref 0.0–0.2)

## 2021-11-03 LAB — ETHANOL: Alcohol, Ethyl (B): <10 mg/dL (ref <10)

## 2021-11-03 LAB — COMPREHENSIVE METABOLIC PANEL
ALT: 23 U/L (ref 0–44)
AST: 30 U/L (ref 15–41)
Albumin: 4 g/dL (ref 3.5–5.0)
Alkaline Phosphatase: 85 U/L (ref 38–126)
Anion gap: 8 (ref 5–15)
BUN: 30 mg/dL — ABNORMAL HIGH (ref 6–20)
CO2: 26 mmol/L (ref 22–32)
Calcium: 9.3 mg/dL (ref 8.9–10.3)
Chloride: 109 mmol/L (ref 98–111)
Creatinine, Ser: 1.52 mg/dL — ABNORMAL HIGH (ref 0.44–1.00)
GFR, Estimated: 39 mL/min — ABNORMAL LOW (ref >60)
Glucose, Bld: 87 mg/dL (ref 70–99)
Potassium: 3.8 mmol/L (ref 3.5–5.1)
Sodium: 143 mmol/L (ref 135–145)
Total Bilirubin: 0.6 mg/dL (ref 0.3–1.2)
Total Protein: 6.7 g/dL (ref 6.5–8.1)

## 2021-11-03 LAB — LACTIC ACID, PLASMA: Lactic Acid, Venous: 0.5 mmol/L (ref 0.5–1.9)

## 2021-11-03 MED ORDER — SODIUM CHLORIDE 0.9 % IV BOLUS: 1000.0000 mL | Freq: Once | INTRAVENOUS | Status: DC

## 2021-11-03 NOTE — ED Provider Notes (Signed)
I attempted to call the patient to discuss her thyroid test including her elevated TSH.  I was not able to reach her and left a HIPAA compliant voicemail just requesting a call back what without leaving any patient's specific information.  I also sent the patient a message via MyChart requesting that she reach out to Dr. Pilar Plate office office this coming week and set up an urgent follow-up with him for further assessment of her thyroid.  Multiple attempts to contact on-call provider for [facility] internal medicine and by [place] today, unable to reach Dr. Clarene Critchley, MD 11/04/21 [phone]

## 2021-11-03 NOTE — ED Provider Notes (Incomplete)
TSH 

## 2021-11-03 NOTE — ED Triage Notes (Signed)
Brought from [facility] for syncopal episode Wednesday this week. Presents with right foot swelling. Recently treated for UTI and [facility] doctor believes patient should be worked up for sepsis

## 2021-11-03 NOTE — ED Triage Notes (Signed)
Pt to ED from [facility]. Pt was seen at [facility] today to see if her sepsis came back from UTI. Pt hospitalized for urosepsis and sent home on PO antibiotics. Pt states she has been having visual hallucinations. Pt denies fevers, chills, diarrhea. Pt endorses SOB and bilateral low back pain.

## 2021-11-03 NOTE — Telephone Encounter (Signed)
   TSH elevated at 10.3.  Patient has discharge, she did not wish to wait to receive results.  Her thyroid medications are managed by [facility] internal medicine doctor [phone].  Given the extent and longevity of the patient's symptoms I do not think the patient is having acute thyroid crisis, and she is on thyroid replacement treatment.  I have called to inform internal medicine physician of the patient's clinical assessment, and TSH.  They had referred her here   ----------------------------------------- 11:22 AM on 11-16-21 ----------------------------------------- I was able to contact patient's primary care clinic today with results and they will follow-up with patient regarding TSH

## 2021-11-03 NOTE — ED Provider Triage Note (Signed)
Emergency Medicine Provider Triage Evaluation Note  Eileen Novak , a 61 y.o. female  was evaluated in triage.  Pt complains of hallucinations and syncopal episode earlier in the week. She has also had some right foot swelling with no recent injury. Recent admission for sepsis (? Urosepsis).  Physical Exam  BP 122/90 (BP Location: Right Arm)   Pulse 87   Temp 97.8 F (36.6 C) (Oral)   Resp 18   Ht 5\' 6"  (1.676 m)   Wt 71 kg   SpO2 98%   BMI 25.26 kg/m  Gen:   Awake, no distress   Resp:  Normal effort  MSK:   Moves extremities without difficulty  Other:    Medical Decision Making  Medically screening exam initiated at 12:53 PM.  Appropriate orders placed.  Eileen Novak was informed that the remainder of the evaluation will be completed by another provider, this initial triage assessment does not replace that evaluation, and the importance of remaining in the ED until their evaluation is complete.    , FNP 11/03/21 1256

## 2021-11-03 NOTE — Discharge Instructions (Signed)
I recommend that you not drive as if you were to have an episode of confusion you could become lost or injured.   Please continue to follow-up closely with your primary doctor as well as Dr. Malvin Johns of neurology.  You may wish to also engage [facility] as you have not yet been able to reach a psychiatrist, this is a resource that may be able to provide you with psychiatric treatment more promptly.  Please call to schedule
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
## 2021-11-04 ENCOUNTER — Encounter: Payer: Self-pay | Admitting: Emergency Medicine

## 2021-11-16 NOTE — ED Provider Notes (Signed)
Patient's elevated TSH discussed with Dr. Aldean Baker RN Sharyl Nimrod) today.  She relates that they do manage the patient's thyroid medications.  They will contact patient and she will discuss TSH testing results with one of the clinicians in the clinic today and seutp follow-up as needed via their office.   Sharyn Creamer, MD 11/16/21 1053

## 2022-02-06 ENCOUNTER — Ambulatory Visit
Admission: RE | Admit: 2022-02-06 | Discharge: 2022-02-06 | Disposition: A | Discharge status: Home / Self Care | Payer: BC Managed Care – PPO | Source: Ambulatory Visit | Attending: Obstetrics | Admitting: Obstetrics

## 2022-02-06 DIAGNOSIS — Z1231 Encounter for screening mammogram for malignant neoplasm of breast: Secondary | ICD-10-CM | POA: Diagnosis not present

## 2022-09-17 ENCOUNTER — Other Ambulatory Visit: Payer: Self-pay | Admitting: Internal Medicine

## 2022-09-17 DIAGNOSIS — F411 Generalized anxiety disorder: Secondary | ICD-10-CM

## 2022-09-17 DIAGNOSIS — R634 Abnormal weight loss: Secondary | ICD-10-CM

## 2022-09-24 ENCOUNTER — Ambulatory Visit
Admission: RE | Admit: 2022-09-24 | Discharge: 2022-09-24 | Disposition: A | Discharge status: Home / Self Care | Payer: BC Managed Care – PPO | Source: Ambulatory Visit | Attending: Internal Medicine | Admitting: Internal Medicine

## 2022-09-24 DIAGNOSIS — F411 Generalized anxiety disorder: Secondary | ICD-10-CM | POA: Diagnosis present

## 2022-09-24 DIAGNOSIS — R634 Abnormal weight loss: Secondary | ICD-10-CM

## 2022-09-24 MED ORDER — IOHEXOL 300 MG/ML  SOLN
100.0000 mL | Freq: Once | INTRAMUSCULAR | Status: AC | PRN
  Administered 2022-09-24: 80 mL via INTRAVENOUS

## 2023-01-17 ENCOUNTER — Other Ambulatory Visit: Payer: Self-pay | Admitting: Internal Medicine

## 2023-01-17 DIAGNOSIS — Z1231 Encounter for screening mammogram for malignant neoplasm of breast: Secondary | ICD-10-CM

## 2023-04-17 ENCOUNTER — Ambulatory Visit
Admission: RE | Admit: 2023-04-17 | Discharge: 2023-04-17 | Disposition: A | Discharge status: Home / Self Care | Payer: BC Managed Care – PPO | Source: Ambulatory Visit | Attending: Internal Medicine | Admitting: Internal Medicine

## 2023-04-17 DIAGNOSIS — Z1231 Encounter for screening mammogram for malignant neoplasm of breast: Secondary | ICD-10-CM | POA: Diagnosis present

## 2023-09-12 ENCOUNTER — Encounter: Payer: Self-pay | Admitting: Gastroenterology

## 2023-09-13 ENCOUNTER — Encounter (HOSPITAL_COMMUNITY): Payer: Self-pay

## 2023-09-19 ENCOUNTER — Encounter: Payer: Self-pay | Admitting: Gastroenterology

## 2023-09-20 ENCOUNTER — Encounter: Payer: Self-pay | Admitting: Gastroenterology

## 2023-09-20 ENCOUNTER — Ambulatory Visit
Admission: RE | Admit: 2023-09-20 | Discharge: 2023-09-20 | Disposition: A | Discharge status: Home / Self Care | Payer: Medicare Other | Attending: Gastroenterology | Admitting: Gastroenterology

## 2023-09-20 ENCOUNTER — Other Ambulatory Visit: Payer: Self-pay

## 2023-09-20 ENCOUNTER — Ambulatory Visit: Payer: Self-pay | Admitting: Anesthesiology

## 2023-09-20 ENCOUNTER — Encounter
Admission: RE | Disposition: A | Discharge status: Home / Self Care | Payer: Self-pay | Source: Home / Self Care | Attending: Gastroenterology

## 2023-09-20 DIAGNOSIS — Z79899 Other long term (current) drug therapy: Secondary | ICD-10-CM | POA: Diagnosis not present

## 2023-09-20 DIAGNOSIS — K5730 Diverticulosis of large intestine without perforation or abscess without bleeding: Secondary | ICD-10-CM | POA: Diagnosis not present

## 2023-09-20 DIAGNOSIS — Z1211 Encounter for screening for malignant neoplasm of colon: Secondary | ICD-10-CM | POA: Insufficient documentation

## 2023-09-20 DIAGNOSIS — K6289 Other specified diseases of anus and rectum: Secondary | ICD-10-CM | POA: Insufficient documentation

## 2023-09-20 DIAGNOSIS — E039 Hypothyroidism, unspecified: Secondary | ICD-10-CM | POA: Insufficient documentation

## 2023-09-20 DIAGNOSIS — K219 Gastro-esophageal reflux disease without esophagitis: Secondary | ICD-10-CM | POA: Insufficient documentation

## 2023-09-20 DIAGNOSIS — Z87891 Personal history of nicotine dependence: Secondary | ICD-10-CM | POA: Diagnosis not present

## 2023-09-20 DIAGNOSIS — Z83719 Family history of colon polyps, unspecified: Secondary | ICD-10-CM | POA: Diagnosis not present

## 2023-09-20 DIAGNOSIS — F32A Depression, unspecified: Secondary | ICD-10-CM | POA: Insufficient documentation

## 2023-09-20 DIAGNOSIS — F419 Anxiety disorder, unspecified: Secondary | ICD-10-CM | POA: Insufficient documentation

## 2023-09-20 DIAGNOSIS — Z79890 Hormone replacement therapy (postmenopausal): Secondary | ICD-10-CM | POA: Insufficient documentation

## 2023-09-20 DIAGNOSIS — I10 Essential (primary) hypertension: Secondary | ICD-10-CM | POA: Diagnosis not present

## 2023-09-20 HISTORY — DX: Hypothyroidism, unspecified: E03.9

## 2023-09-20 HISTORY — PX: COLONOSCOPY WITH PROPOFOL: SHX5780

## 2023-09-20 HISTORY — DX: Other cervical disc degeneration, unspecified cervical region: M50.30

## 2023-09-20 HISTORY — DX: Prediabetes: R73.03

## 2023-09-20 HISTORY — DX: Gastro-esophageal reflux disease without esophagitis: K21.9

## 2023-09-20 HISTORY — DX: Essential (primary) hypertension: I10

## 2023-09-20 HISTORY — DX: Unspecified abnormalities of gait and mobility: R26.9

## 2023-09-20 HISTORY — DX: Tachycardia, unspecified: R00.0

## 2023-09-20 HISTORY — DX: Unspecified osteoarthritis, unspecified site: M19.90

## 2023-09-20 SURGERY — COLONOSCOPY WITH PROPOFOL
Anesthesia: General

## 2023-09-20 MED ORDER — SODIUM CHLORIDE 0.9 % IV SOLN: INTRAVENOUS | Status: DC

## 2023-09-20 MED ORDER — DEXMEDETOMIDINE HCL IN NACL 200 MCG/50ML IV SOLN
INTRAVENOUS | Status: DC | PRN
  Administered 2023-09-20: 4 ug via INTRAVENOUS

## 2023-09-20 MED ORDER — PROPOFOL 500 MG/50ML IV EMUL
INTRAVENOUS | Status: DC | PRN
  Administered 2023-09-20: 140 ug/kg/min via INTRAVENOUS
  Administered 2023-09-20 (×2): 50 mg via INTRAVENOUS

## 2023-09-20 MED ORDER — PROPOFOL 1000 MG/100ML IV EMUL
INTRAVENOUS | Status: AC
  Filled 2023-09-20: qty 100

## 2023-09-20 NOTE — Interval H&P Note (Signed)
 History and Physical Interval Note: Preprocedure H&P from 09/20/23  was reviewed and there was no interval change after seeing and examining the patient.  Written consent was obtained from the patient after discussion of risks, benefits, and alternatives. Patient has consented to proceed with Colonoscopy with possible intervention   09/20/2023 8:16 AM  Eileen Novak  has presented today for surgery, with the diagnosis of Z86.0101 (ICD-10-CM) - History of adenomatous polyp of colon.  The various methods of treatment have been discussed with the patient and family. After consideration of risks, benefits and other options for treatment, the patient has consented to  Procedure(s): COLONOSCOPY WITH PROPOFOL (N/A) as a surgical intervention.  The patient's history has been reviewed, patient examined, no change in status, stable for surgery.  I have reviewed the patient's chart and labs.  Questions were answered to the patient's satisfaction.     Quintin Buckle
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
## 2023-09-20 NOTE — Op Note (Addendum)
 [facility] Gastroenterology Patient Name: Eileen Novak Procedure Date: 09/20/2023 7:13 AM MRN: [id number] Account #: [id number] Date of Birth: 01-15-1961 Admit Type: Outpatient Age: 63 Room: [facility] ENDO ROOM 1 Gender: Female Note Status: Supervisor Override Instrument Name: Peds Colonoscope [id number] Procedure:             Colonoscopy Indications:           Colon cancer screening in patient at increased risk:                         Family history of colon polyps in multiple 1st-degree                         relatives, High risk colon cancer surveillance:                         Personal history of colonic polyps Providers:             Bridgett Camps, DO Referring MD:          Rosalynn Come. Claudius Cumins, MD (Referring MD) Medicines:             Monitored Anesthesia Care Complications:         No immediate complications. Estimated blood loss: None. Procedure:             Pre-Anesthesia Assessment:                        - Prior to the procedure, a History and Physical was                         performed, and patient medications and allergies were                         reviewed. The patient is competent. The risks and                         benefits of the procedure and the sedation options and                         risks were discussed with the patient. All questions                         were answered and informed consent was obtained.                         Patient identification and proposed procedure were                         verified by the physician, the nurse, the anesthetist                         and the technician in the endoscopy suite. Mental                         Status Examination: alert and oriented. Airway                         Examination: normal oropharyngeal airway and neck  mobility. Respiratory Examination: clear to                         auscultation. CV Examination: RRR, no murmurs, no S3                          or S4. Prophylactic Antibiotics: The patient does not                         require prophylactic antibiotics. Prior                         Anticoagulants: The patient has taken no anticoagulant                         or antiplatelet agents. ASA Grade Assessment: II - A                         patient with mild systemic disease. After reviewing                         the risks and benefits, the patient was deemed in                         satisfactory condition to undergo the procedure. The                         anesthesia plan was to use monitored anesthesia care                         (MAC). Immediately prior to administration of                         medications, the patient was re-assessed for adequacy                         to receive sedatives. The heart rate, respiratory                         rate, oxygen saturations, blood pressure, adequacy of                         pulmonary ventilation, and response to care were                         monitored throughout the procedure. The physical                         status of the patient was re-assessed after the                         procedure.                        After obtaining informed consent, the colonoscope was                         passed under direct vision. Throughout the procedure,  the patient's blood pressure, pulse, and oxygen                         saturations were monitored continuously. The                         Colonoscope was introduced through the anus and                         advanced to the the cecum, identified by appendiceal                         orifice and ileocecal valve. The colonoscopy was                         somewhat difficult due to a redundant colon,                         significant looping and a tortuous colon. Successful                         completion of the procedure was aided by changing the                         patient  to a supine position, straightening and                         shortening the scope to obtain bowel loop reduction,                         using scope torsion, applying abdominal pressure and                         lavage. The patient tolerated the procedure well. The                         quality of the bowel preparation was evaluated using                         the BBPS [facility] Bowel Preparation Scale) with scores                         of: Right Colon = 3, Transverse Colon = 3 and Left                         Colon = 3 (entire mucosa seen well with no residual                         staining, small fragments of stool or opaque liquid).                         The total BBPS score equals 9. The ileocecal valve,                         appendiceal orifice, and rectum were photographed. Findings:      The perianal and digital rectal examinations were normal. Pertinent  negatives include normal sphincter tone.      A few small-mouthed diverticula were found in the sigmoid colon.       Estimated blood loss: none.      Anal papilla(e) were hypertrophied. Estimated blood loss: none.      The exam was otherwise without abnormality on direct and retroflexion       views. Impression:            - Diverticulosis in the sigmoid colon.                        - Anal papilla(e) were hypertrophied.                        - The examination was otherwise normal on direct and                         retroflexion views.                        - No specimens collected. Recommendation:        - Patient has a contact number available for                         emergencies. The signs and symptoms of potential                         delayed complications were discussed with the patient.                         Return to normal activities tomorrow. Written                         discharge instructions were provided to the patient.                        - Discharge patient to home.                         - Resume previous diet.                        - Continue present medications.                        - Repeat colonoscopy in 5 years for screening purposes.                        - Return to GI office at appointment to be scheduled.                        - Please call the office to arrange appointment                         [phone]) or ask your pcp for referral to address                         your constipation.                        Can start with one  capful of miralax  twice a day                         starting tomorrow.                        - The findings and recommendations were discussed with                         the patient. Procedure Code(s):     --- Professional ---                        [phone], Colonoscopy, flexible; diagnostic, including                         collection of specimen(s) by brushing or washing, when                         performed (separate procedure) Diagnosis Code(s):     --- Professional ---                        Z83.71, Family history of colonic polyps                        K62.89, Other specified diseases of anus and rectum                        K57.30, Diverticulosis of large intestine without                         perforation or abscess without bleeding CPT copyright 2022 American Medical Association. All rights reserved. The codes documented in this report are preliminary and upon coder review may  be revised to meet current compliance requirements. Attending Participation:      I personally performed the entire procedure. Polo Brisk, DO Quintin Buckle DO, DO 09/20/2023 8:57:01 AM This report has been signed electronically. Number of Addenda: 0 Note Initiated On: 09/20/2023 7:13 AM Scope Withdrawal Time: 0 hours 10 minutes 32 seconds  Total Procedure Duration: 0 hours 26 minutes 25 seconds  Estimated Blood Loss:  Estimated blood loss: none.      [facility]
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
## 2023-09-20 NOTE — Transfer of Care (Signed)
 Immediate Anesthesia Transfer of Care Note  Patient: Eileen Novak  Procedure(s) Performed: COLONOSCOPY WITH PROPOFOL  Patient Location: PACU  Anesthesia Type:MAC  Level of Consciousness: awake and alert   Airway & Oxygen Therapy: Patient Spontanous Breathing  Post-op Assessment: Report given to RN  Post vital signs: stable  Last Vitals:  Vitals Value Taken Time  BP 123/75 09/20/23 0856  Temp 36.1 C 09/20/23 0856  Pulse 74 09/20/23 0856  Resp 18 09/20/23 0856  SpO2 100 % 09/20/23 0856  Vitals shown include unfiled device data.  Last Pain:  Vitals:   09/20/23 0856  TempSrc: Temporal  PainSc: Asleep         Complications: No notable events documented.

## 2023-09-20 NOTE — Anesthesia Postprocedure Evaluation (Signed)
 Anesthesia Post Note  Patient: Eileen Novak  Procedure(s) Performed: COLONOSCOPY WITH PROPOFOL  Patient location during evaluation: PACU Anesthesia Type: General Level of consciousness: awake Pain management: pain level controlled Vital Signs Assessment: post-procedure vital signs reviewed and stable Respiratory status: spontaneous breathing Cardiovascular status: stable Anesthetic complications: no   No notable events documented.   Last Vitals:  Vitals:   09/20/23 0856 09/20/23 0906  BP: 123/75 135/88  Pulse: 74 68  Resp: 17 11  Temp: (!) 36.1 C   SpO2: 100% 100%    Last Pain:  Vitals:   09/20/23 0856  TempSrc: Temporal  PainSc: Asleep                 VAN STAVEREN,Pradyun Ishman

## 2023-09-21 ENCOUNTER — Encounter: Payer: Self-pay | Admitting: Gastroenterology

## 2023-11-14 ENCOUNTER — Other Ambulatory Visit: Payer: Self-pay | Admitting: Cardiovascular Disease

## 2023-11-14 DIAGNOSIS — R000 Tachycardia, unspecified: Secondary | ICD-10-CM

## 2023-11-14 DIAGNOSIS — I447 Left bundle-branch block, unspecified: Secondary | ICD-10-CM

## 2023-11-14 DIAGNOSIS — I5020 Unspecified systolic (congestive) heart failure: Secondary | ICD-10-CM

## 2023-11-21 ENCOUNTER — Encounter (HOSPITAL_COMMUNITY): Payer: Self-pay

## 2023-11-22 ENCOUNTER — Other Ambulatory Visit (HOSPITAL_COMMUNITY): Payer: Self-pay | Admitting: Cardiovascular Disease

## 2023-11-22 DIAGNOSIS — I447 Left bundle-branch block, unspecified: Secondary | ICD-10-CM

## 2023-11-22 DIAGNOSIS — I5020 Unspecified systolic (congestive) heart failure: Secondary | ICD-10-CM

## 2023-11-25 ENCOUNTER — Ambulatory Visit
Admission: RE | Admit: 2023-11-25 | Discharge: 2023-11-25 | Disposition: A | Discharge status: Home / Self Care | Source: Ambulatory Visit | Attending: Cardiovascular Disease | Admitting: Cardiovascular Disease

## 2023-11-25 DIAGNOSIS — R000 Tachycardia, unspecified: Secondary | ICD-10-CM | POA: Insufficient documentation

## 2023-11-25 DIAGNOSIS — I447 Left bundle-branch block, unspecified: Secondary | ICD-10-CM | POA: Insufficient documentation

## 2023-11-25 DIAGNOSIS — I5020 Unspecified systolic (congestive) heart failure: Secondary | ICD-10-CM | POA: Insufficient documentation

## 2023-11-25 MED ORDER — NITROGLYCERIN 0.4 MG SL SUBL
0.8000 mg | SUBLINGUAL_TABLET | Freq: Once | SUBLINGUAL | Status: AC
  Administered 2023-11-25: 0.8 mg via SUBLINGUAL
  Filled 2023-11-25: qty 25

## 2023-11-25 MED ORDER — IOHEXOL 350 MG/ML SOLN
100.0000 mL | Freq: Once | INTRAVENOUS | Status: AC | PRN
  Administered 2023-11-25: 100 mL via INTRAVENOUS

## 2023-11-25 NOTE — Progress Notes (Signed)
 Patient tolerated CT well. Vital signs stable encourage to drink water throughout day.Reasons explained and verbalized understanding. Ambulated steady gait.
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
## 2023-12-16 ENCOUNTER — Encounter (HOSPITAL_COMMUNITY): Payer: Self-pay

## 2023-12-19 ENCOUNTER — Other Ambulatory Visit (HOSPITAL_COMMUNITY): Payer: Self-pay | Admitting: Cardiovascular Disease

## 2023-12-19 ENCOUNTER — Ambulatory Visit (HOSPITAL_COMMUNITY)
Admission: RE | Admit: 2023-12-19 | Discharge: 2023-12-19 | Disposition: A | Discharge status: Home / Self Care | Source: Ambulatory Visit | Attending: Cardiovascular Disease | Admitting: Cardiovascular Disease

## 2023-12-19 DIAGNOSIS — I5020 Unspecified systolic (congestive) heart failure: Secondary | ICD-10-CM

## 2023-12-19 DIAGNOSIS — I447 Left bundle-branch block, unspecified: Secondary | ICD-10-CM | POA: Diagnosis present

## 2023-12-19 MED ORDER — GADOBUTROL 1 MMOL/ML IV SOLN
8.0000 mL | Freq: Once | INTRAVENOUS | Status: AC | PRN
  Administered 2023-12-19: 8 mL via INTRAVENOUS

## 2023-12-25 ENCOUNTER — Other Ambulatory Visit: Payer: Self-pay | Admitting: Internal Medicine

## 2023-12-25 DIAGNOSIS — Z1231 Encounter for screening mammogram for malignant neoplasm of breast: Secondary | ICD-10-CM

## 2024-01-20 ENCOUNTER — Emergency Department
Admission: EM | Admit: 2024-01-20 | Discharge: 2024-01-20 | Disposition: A | Discharge status: Home / Self Care | Attending: Emergency Medicine | Admitting: Emergency Medicine

## 2024-01-20 ENCOUNTER — Other Ambulatory Visit: Payer: Self-pay

## 2024-01-20 DIAGNOSIS — I7300 Raynaud's syndrome without gangrene: Secondary | ICD-10-CM | POA: Insufficient documentation

## 2024-01-20 DIAGNOSIS — E039 Hypothyroidism, unspecified: Secondary | ICD-10-CM | POA: Insufficient documentation

## 2024-01-20 DIAGNOSIS — I129 Hypertensive chronic kidney disease with stage 1 through stage 4 chronic kidney disease, or unspecified chronic kidney disease: Secondary | ICD-10-CM | POA: Diagnosis not present

## 2024-01-20 DIAGNOSIS — N189 Chronic kidney disease, unspecified: Secondary | ICD-10-CM | POA: Diagnosis not present

## 2024-01-20 DIAGNOSIS — R202 Paresthesia of skin: Secondary | ICD-10-CM | POA: Diagnosis present

## 2024-01-20 LAB — BASIC METABOLIC PANEL WITH GFR
Anion gap: 10 (ref 5–15)
BUN: 40 mg/dL — ABNORMAL HIGH (ref 8–23)
CO2: 21 mmol/L — ABNORMAL LOW (ref 22–32)
Calcium: 9.6 mg/dL (ref 8.9–10.3)
Chloride: 110 mmol/L (ref 98–111)
Creatinine, Ser: 1.62 mg/dL — ABNORMAL HIGH (ref 0.44–1.00)
GFR, Estimated: 36 mL/min — ABNORMAL LOW (ref >60)
Glucose, Bld: 103 mg/dL — ABNORMAL HIGH (ref 70–99)
Potassium: 4.5 mmol/L (ref 3.5–5.1)
Sodium: 141 mmol/L (ref 135–145)

## 2024-01-20 LAB — CBC WITH DIFFERENTIAL/PLATELET
Abs Immature Granulocytes: 0.01 K/uL (ref 0.00–0.07)
Basophils Absolute: 0 K/uL (ref 0.0–0.1)
Basophils Relative: 0 %
Eosinophils Absolute: 0.1 K/uL (ref 0.0–0.5)
Eosinophils Relative: 3 %
HCT: 35.3 % — ABNORMAL LOW (ref 36.0–46.0)
Hemoglobin: 11.5 g/dL — ABNORMAL LOW (ref 12.0–15.0)
Immature Granulocytes: 0 %
Lymphocytes Relative: 43 %
Lymphs Abs: 1.9 K/uL (ref 0.7–4.0)
MCH: 31.9 pg (ref 26.0–34.0)
MCHC: 32.6 g/dL (ref 30.0–36.0)
MCV: 97.8 fL (ref 80.0–100.0)
Monocytes Absolute: 0.4 K/uL (ref 0.1–1.0)
Monocytes Relative: 8 %
Neutro Abs: 2.1 K/uL (ref 1.7–7.7)
Neutrophils Relative %: 46 %
Platelets: 264 K/uL (ref 150–400)
RBC: 3.61 MIL/uL — ABNORMAL LOW (ref 3.87–5.11)
RDW: 12.3 % (ref 11.5–15.5)
WBC: 4.5 K/uL (ref 4.0–10.5)
nRBC: 0 % (ref 0.0–0.2)

## 2024-01-20 LAB — MAGNESIUM: Magnesium: 2.3 mg/dL (ref 1.7–2.4)

## 2024-01-20 NOTE — ED Provider Notes (Signed)
 [name] Provider Note    Event Date/Time   First MD Initiated Contact with Patient 01/20/24 1029     (approximate)   History   Chief Complaint fingers tinging   HPI  Eileen Novak is a 63 y.o. female with past medical history of hypertension, hypothyroidism, CKD, and anxiety who presents to the ED complaining of tingling.  Patient reports that over the past 3 days she has been dealing with intermittent tingling and discoloration in the fingers of her hand, particularly her right middle finger.  She states that it seems to happen in response to cold, when the finger becomes pale and tingly.  She denies significant pain with this, describes it more as a discomfort.  She has noticed some slight discoloration of her finger, but denies any difficulty moving the finger.     Physical Exam   Triage Vital Signs: ED Triage Vitals  Encounter Vitals Group     BP 01/20/24 1022 (!) 156/86     Girls Systolic BP Percentile --      Girls Diastolic BP Percentile --      Boys Systolic BP Percentile --      Boys Diastolic BP Percentile --      Pulse Rate 01/20/24 1022 77     Resp 01/20/24 1022 11     Temp 01/20/24 1022 98.2 F (36.8 C)     Temp Source 01/20/24 1022 Oral     SpO2 01/20/24 1022 100 %     Weight 01/20/24 1016 130 lb 1.1 oz (59 kg)     Height --      Head Circumference --      Peak Flow --      Pain Score 01/20/24 1016 0     Pain Loc --      Pain Education --      Exclude from Growth Chart --     Most recent vital signs: Vitals:   01/20/24 1022 01/20/24 1100  BP: (!) 156/86 (!) 142/90  Pulse: 77 72  Resp: 11 11  Temp: 98.2 F (36.8 C)   SpO2: 100% 100%    Constitutional: Alert and oriented. Eyes: Conjunctivae are normal. Head: Atraumatic. Nose: No congestion/rhinnorhea. Mouth/Throat: Mucous membranes are moist.  Cardiovascular: Normal rate, regular rhythm. Grossly normal heart sounds.  2+ radial pulses  bilaterally. Respiratory: Normal respiratory effort.  No retractions. Lungs CTAB. Gastrointestinal: Soft and nontender. No distention. Musculoskeletal: No lower extremity tenderness nor edema.  Mild dark discoloration of the dorsal portion of the right middle finger, cap refill less than 2 seconds throughout digits of both hands.  Range of motion intact throughout both hands. Neurologic:  Normal speech and language. No gross focal neurologic deficits are appreciated.    ED Results / Procedures / Treatments   Labs (all labs ordered are listed, but only abnormal results are displayed) Labs Reviewed  CBC WITH DIFFERENTIAL/PLATELET - Abnormal; Notable for the following components:      Result Value   RBC 3.61 (*)    Hemoglobin 11.5 (*)    HCT 35.3 (*)    All other components within normal limits  BASIC METABOLIC PANEL WITH GFR - Abnormal; Notable for the following components:   CO2 21 (*)    Glucose, Bld 103 (*)    BUN 40 (*)    Creatinine, Ser 1.62 (*)    GFR, Estimated 36 (*)    All other components within normal limits  MAGNESIUM      EKG  ED ECG REPORT I, Carlin Palin, the attending physician, personally viewed and interpreted this ECG.   Date: 01/20/2024  EKG Time: 10:18  Rate: 80  Rhythm: normal sinus rhythm  Axis: Normal  Intervals:none  ST&T Change: None  PROCEDURES:  Critical Care performed: No  Procedures   MEDICATIONS ORDERED IN ED: Medications - No data to display   IMPRESSION / MDM / ASSESSMENT AND PLAN / ED COURSE  I reviewed the triage vital signs and the nursing notes.                              63 y.o. female with past medical history of hypertension, hypothyroidism, CKD, and anxiety who presents to the ED with intermittent tingling and pale discoloration of her fingers, particular the right middle.  Patient's presentation is most consistent with acute presentation with potential threat to life or bodily function.  Differential diagnosis  includes, but is not limited to, ischemia, Raynaud's phenomenon, anemia, electrolyte abnormality, AKI.  Patient nontoxic-appearing and in no acute distress, vital signs are unremarkable.  She is neurovascular intact throughout both hands, does have some mild dark discoloration of the dorsal portion of her right middle finger.  Description of episodes consistent with Raynaud's phenomenon, no evidence of ischemia at this time.  Labs with stable CKD and no significant anemia, electrolyte abnormality, or AKI.  Magnesium  level within normal limits.  Patient appropriate for discharge home with outpatient vascular surgery follow-up, was counseled to return to the ED for new or worsening symptoms.  Patient agrees with plan.      FINAL CLINICAL IMPRESSION(S) / ED DIAGNOSES   Final diagnoses:  Raynaud's phenomenon without gangrene     Rx / DC Orders   ED Discharge Orders     None        Note:  This document was prepared using Dragon voice recognition software and may include unintentional dictation errors.   Palin Carlin, MD 01/20/24 [phone]

## 2024-01-20 NOTE — ED Triage Notes (Signed)
 Fingers tingling x several days.  Right third finger turning purple.  Had blood work this morning at [facility], cardiology advised patient to come to ED.  AAOx3.  Skin warm and dry. No SOB/ DOE. NAD

## 2024-01-29 ENCOUNTER — Other Ambulatory Visit (INDEPENDENT_AMBULATORY_CARE_PROVIDER_SITE_OTHER): Payer: Self-pay | Admitting: Vascular Surgery

## 2024-01-29 DIAGNOSIS — I7300 Raynaud's syndrome without gangrene: Secondary | ICD-10-CM

## 2024-01-31 ENCOUNTER — Ambulatory Visit (INDEPENDENT_AMBULATORY_CARE_PROVIDER_SITE_OTHER): Admitting: Nurse Practitioner

## 2024-01-31 ENCOUNTER — Ambulatory Visit (INDEPENDENT_AMBULATORY_CARE_PROVIDER_SITE_OTHER)

## 2024-01-31 ENCOUNTER — Encounter (INDEPENDENT_AMBULATORY_CARE_PROVIDER_SITE_OTHER): Payer: Self-pay | Admitting: Nurse Practitioner

## 2024-01-31 VITALS — BP 136/82 | HR 82 | Wt 138.5 lb

## 2024-01-31 DIAGNOSIS — L819 Disorder of pigmentation, unspecified: Secondary | ICD-10-CM | POA: Diagnosis not present

## 2024-01-31 DIAGNOSIS — I7300 Raynaud's syndrome without gangrene: Secondary | ICD-10-CM
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
## 2024-02-07 ENCOUNTER — Telehealth (INDEPENDENT_AMBULATORY_CARE_PROVIDER_SITE_OTHER): Payer: Self-pay

## 2024-02-07 NOTE — Telephone Encounter (Signed)
 Called and spoke with patient, she has started her Asprin and stated her hands were feeling some better as of right now, there is a little tingling but not as bad as this morning. She was pleased with the feedback and was going to put some gloves on when we got off the phone. Reminded patient to call back with any changes or concerns

## 2024-02-07 NOTE — Telephone Encounter (Signed)
 Patient called and left voicemail in reference to her finger tips tingling yesterday and this morning. Patient stated they are not turning colors at this time, she just wanted to make [facility] aware and see if she had any advice.  Spoke to Orvin Daring, NP and she advised for patient to start taking her Asprin at this time if she hasn't already, remind her she has good flow, and to keep her hands warm with the temperatures being cooler and getting warmer throughout the day.

## 2024-02-09 ENCOUNTER — Observation Stay

## 2024-02-09 ENCOUNTER — Other Ambulatory Visit: Payer: Self-pay

## 2024-02-09 ENCOUNTER — Observation Stay: Admission: EM | Admit: 2024-02-09 | Discharge: 2024-02-10 | Disposition: A

## 2024-02-09 ENCOUNTER — Encounter: Payer: Self-pay | Admitting: Internal Medicine

## 2024-02-09 DIAGNOSIS — Z87891 Personal history of nicotine dependence: Secondary | ICD-10-CM | POA: Insufficient documentation

## 2024-02-09 DIAGNOSIS — N189 Chronic kidney disease, unspecified: Principal | ICD-10-CM

## 2024-02-09 DIAGNOSIS — I5032 Chronic diastolic (congestive) heart failure: Secondary | ICD-10-CM | POA: Diagnosis not present

## 2024-02-09 DIAGNOSIS — E039 Hypothyroidism, unspecified: Secondary | ICD-10-CM | POA: Diagnosis not present

## 2024-02-09 DIAGNOSIS — Z79899 Other long term (current) drug therapy: Secondary | ICD-10-CM | POA: Diagnosis not present

## 2024-02-09 DIAGNOSIS — F1090 Alcohol use, unspecified, uncomplicated: Secondary | ICD-10-CM | POA: Diagnosis not present

## 2024-02-09 DIAGNOSIS — I5022 Chronic systolic (congestive) heart failure: Secondary | ICD-10-CM | POA: Insufficient documentation

## 2024-02-09 DIAGNOSIS — N1832 Chronic kidney disease, stage 3b: Secondary | ICD-10-CM | POA: Insufficient documentation

## 2024-02-09 DIAGNOSIS — R81 Glycosuria: Secondary | ICD-10-CM | POA: Diagnosis not present

## 2024-02-09 DIAGNOSIS — Z7982 Long term (current) use of aspirin: Secondary | ICD-10-CM | POA: Diagnosis not present

## 2024-02-09 DIAGNOSIS — I130 Hypertensive heart and chronic kidney disease with heart failure and stage 1 through stage 4 chronic kidney disease, or unspecified chronic kidney disease: Secondary | ICD-10-CM | POA: Insufficient documentation

## 2024-02-09 DIAGNOSIS — R531 Weakness: Secondary | ICD-10-CM

## 2024-02-09 DIAGNOSIS — R5381 Other malaise: Secondary | ICD-10-CM | POA: Diagnosis present

## 2024-02-09 DIAGNOSIS — N390 Urinary tract infection, site not specified: Secondary | ICD-10-CM | POA: Diagnosis not present

## 2024-02-09 DIAGNOSIS — N179 Acute kidney failure, unspecified: Secondary | ICD-10-CM | POA: Diagnosis not present

## 2024-02-09 LAB — CBC WITH DIFFERENTIAL/PLATELET
Abs Immature Granulocytes: 0.01 K/uL (ref 0.00–0.07)
Basophils Absolute: 0 K/uL (ref 0.0–0.1)
Basophils Relative: 1 %
Eosinophils Absolute: 0.1 K/uL (ref 0.0–0.5)
Eosinophils Relative: 2 %
HCT: 39.6 % (ref 36.0–46.0)
Hemoglobin: 13.3 g/dL (ref 12.0–15.0)
Immature Granulocytes: 0 %
Lymphocytes Relative: 42 %
Lymphs Abs: 2.5 K/uL (ref 0.7–4.0)
MCH: 32 pg (ref 26.0–34.0)
MCHC: 33.6 g/dL (ref 30.0–36.0)
MCV: 95.2 fL (ref 80.0–100.0)
Monocytes Absolute: 0.5 K/uL (ref 0.1–1.0)
Monocytes Relative: 9 %
Neutro Abs: 2.8 K/uL (ref 1.7–7.7)
Neutrophils Relative %: 46 %
Platelets: 363 K/uL (ref 150–400)
RBC: 4.16 MIL/uL (ref 3.87–5.11)
RDW: 12.6 % (ref 11.5–15.5)
WBC: 6 K/uL (ref 4.0–10.5)
nRBC: 0 % (ref 0.0–0.2)

## 2024-02-09 LAB — URINALYSIS, W/ REFLEX TO CULTURE (INFECTION SUSPECTED)
Bacteria, UA: NONE SEEN
Bilirubin Urine: NEGATIVE
Glucose, UA: >=500 mg/dL — AB
Hgb urine dipstick: NEGATIVE
Ketones, ur: NEGATIVE mg/dL
Nitrite: NEGATIVE
Protein, ur: NEGATIVE mg/dL
Specific Gravity, Urine: 1.014 (ref 1.005–1.030)
pH: 5 (ref 5.0–8.0)

## 2024-02-09 LAB — COMPREHENSIVE METABOLIC PANEL WITH GFR
ALT: 17 U/L (ref 0–44)
AST: 27 U/L (ref 15–41)
Albumin: 4.1 g/dL (ref 3.5–5.0)
Alkaline Phosphatase: 77 U/L (ref 38–126)
Anion gap: 15 (ref 5–15)
BUN: 45 mg/dL — ABNORMAL HIGH (ref 8–23)
CO2: 19 mmol/L — ABNORMAL LOW (ref 22–32)
Calcium: 10.5 mg/dL — ABNORMAL HIGH (ref 8.9–10.3)
Chloride: 104 mmol/L (ref 98–111)
Creatinine, Ser: 2.64 mg/dL — ABNORMAL HIGH (ref 0.44–1.00)
GFR, Estimated: 20 mL/min — ABNORMAL LOW (ref >60)
Glucose, Bld: 88 mg/dL (ref 70–99)
Potassium: 4.3 mmol/L (ref 3.5–5.1)
Sodium: 138 mmol/L (ref 135–145)
Total Bilirubin: 0.5 mg/dL (ref 0.0–1.2)
Total Protein: 7.4 g/dL (ref 6.5–8.1)

## 2024-02-09 LAB — RESP PANEL BY RT-PCR (RSV, FLU A&B, COVID)  RVPGX2
Influenza A by PCR: NEGATIVE
Influenza B by PCR: NEGATIVE
Resp Syncytial Virus by PCR: NEGATIVE
SARS Coronavirus 2 by RT PCR: NEGATIVE

## 2024-02-09 LAB — HIV ANTIBODY (ROUTINE TESTING W REFLEX): HIV Screen 4th Generation wRfx: NONREACTIVE

## 2024-02-09 LAB — LIPASE, BLOOD: Lipase: 63 U/L — ABNORMAL HIGH (ref 11–51)

## 2024-02-09 LAB — HEMOGLOBIN A1C
Hgb A1c MFr Bld: 5.4 % (ref 4.8–5.6)
Mean Plasma Glucose: 108.28 mg/dL

## 2024-02-09 MED ORDER — CYCLOBENZAPRINE HCL 10 MG PO TABS: 5.0000 mg | ORAL_TABLET | Freq: Every evening | ORAL | Status: DC | PRN

## 2024-02-09 MED ORDER — HYDRALAZINE HCL 20 MG/ML IJ SOLN: 5.0000 mg | Freq: Four times a day (QID) | INTRAMUSCULAR | Status: DC | PRN

## 2024-02-09 MED ORDER — LISDEXAMFETAMINE DIMESYLATE 30 MG PO CAPS
60.0000 mg | ORAL_CAPSULE | ORAL | Status: DC
Start: 2024-02-09 — End: 2024-02-10
  Administered 2024-02-09 – 2024-02-10 (×2): 60 mg via ORAL
  Filled 2024-02-09 (×2): qty 2

## 2024-02-09 MED ORDER — NORTRIPTYLINE HCL 10 MG PO CAPS: 10.0000 mg | ORAL_CAPSULE | Freq: Every day | ORAL | Status: DC

## 2024-02-09 MED ORDER — SODIUM CHLORIDE 0.9 % IV SOLN: INTRAVENOUS | Status: AC

## 2024-02-09 MED ORDER — ACETAMINOPHEN 325 MG PO TABS
650.0000 mg | ORAL_TABLET | Freq: Four times a day (QID) | ORAL | Status: DC | PRN
  Administered 2024-02-09 – 2024-02-10 (×2): 650 mg via ORAL
  Filled 2024-02-09 (×2): qty 2

## 2024-02-09 MED ORDER — QUETIAPINE FUMARATE 25 MG PO TABS: 50.0000 mg | ORAL_TABLET | Freq: Every day | ORAL | Status: DC

## 2024-02-09 MED ORDER — HEPARIN SODIUM (PORCINE) 5000 UNIT/ML IJ SOLN
5000.0000 [IU] | Freq: Three times a day (TID) | INTRAMUSCULAR | Status: DC
  Administered 2024-02-09 – 2024-02-10 (×2): 5000 [IU] via SUBCUTANEOUS
  Filled 2024-02-09 (×2): qty 1

## 2024-02-09 MED ORDER — SODIUM CHLORIDE 0.9 % IV SOLN
1.0000 g | INTRAVENOUS | Status: DC
  Administered 2024-02-09: 1 g via INTRAVENOUS
  Filled 2024-02-09 (×2): qty 10

## 2024-02-09 MED ORDER — THYROID 30 MG PO TABS: 30.0000 mg | ORAL_TABLET | Freq: Every day | ORAL | Status: DC

## 2024-02-09 MED ORDER — ONDANSETRON HCL 4 MG PO TABS: 4.0000 mg | ORAL_TABLET | Freq: Four times a day (QID) | ORAL | Status: DC | PRN

## 2024-02-09 MED ORDER — PANTOPRAZOLE SODIUM 40 MG PO TBEC
40.0000 mg | DELAYED_RELEASE_TABLET | Freq: Every day | ORAL | Status: DC
  Administered 2024-02-09 – 2024-02-10 (×2): 40 mg via ORAL
  Filled 2024-02-09 (×2): qty 1

## 2024-02-09 MED ORDER — ONDANSETRON HCL 4 MG/2ML IJ SOLN: 4.0000 mg | Freq: Four times a day (QID) | INTRAMUSCULAR | Status: DC | PRN

## 2024-02-09 MED ORDER — LACTATED RINGERS IV BOLUS
1000.0000 mL | Freq: Once | INTRAVENOUS | Status: AC
  Administered 2024-02-09: 1000 mL via INTRAVENOUS

## 2024-02-09 MED ORDER — BREXPIPRAZOLE 0.25 MG PO TABS
0.5000 mg | ORAL_TABLET | Freq: Every day | ORAL | Status: DC
  Filled 2024-02-09: qty 2

## 2024-02-09 MED ORDER — VENLAFAXINE HCL ER 150 MG PO CP24: 150.0000 mg | ORAL_CAPSULE | Freq: Every day | ORAL | Status: DC

## 2024-02-09 MED ORDER — THYROID 60 MG PO TABS
60.0000 mg | ORAL_TABLET | Freq: Every day | ORAL | Status: DC
  Administered 2024-02-10: 60 mg via ORAL
  Filled 2024-02-09: qty 1

## 2024-02-09 MED ORDER — CYCLOBENZAPRINE HCL 10 MG PO TABS
10.0000 mg | ORAL_TABLET | Freq: Every day | ORAL | Status: DC
  Administered 2024-02-09: 10 mg via ORAL
  Filled 2024-02-09: qty 1

## 2024-02-09 MED ORDER — ACETAMINOPHEN 650 MG RE SUPP: 650.0000 mg | Freq: Four times a day (QID) | RECTAL | Status: DC | PRN

## 2024-02-09 MED ORDER — BUPROPION HCL ER (XL) 150 MG PO TB24
300.0000 mg | ORAL_TABLET | Freq: Every day | ORAL | Status: DC
  Administered 2024-02-09 – 2024-02-10 (×2): 300 mg via ORAL
  Filled 2024-02-09 (×2): qty 2

## 2024-02-09 MED ORDER — ALPRAZOLAM 0.25 MG PO TABS: 0.2500 mg | ORAL_TABLET | Freq: Every evening | ORAL | Status: DC | PRN

## 2024-02-09 MED ORDER — PREGABALIN 50 MG PO CAPS
100.0000 mg | ORAL_CAPSULE | Freq: Two times a day (BID) | ORAL | Status: DC
Start: 2024-02-09 — End: 2024-02-09

## 2024-02-09 MED ORDER — LEVOTHYROXINE SODIUM 50 MCG PO TABS: 100.0000 ug | ORAL_TABLET | Freq: Every day | ORAL | Status: DC

## 2024-02-09 MED ORDER — FAMOTIDINE 20 MG PO TABS
40.0000 mg | ORAL_TABLET | Freq: Every day | ORAL | Status: DC
  Administered 2024-02-09: 40 mg via ORAL
  Filled 2024-02-09: qty 2

## 2024-02-09 NOTE — ED Triage Notes (Signed)
 Pt c/o feeling weak and yucky x3 days. Pt is currently on an abx (x1 week) for UTI and had an episode of emesis 2 days ago. Pt pcp changed the abx and she has only taken 1 because it makes her nauseous. Pt reports cardiac and renal hx.

## 2024-02-09 NOTE — ED Notes (Signed)
 Advised nurse that patient has ready bed

## 2024-02-09 NOTE — Plan of Care (Signed)
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
## 2024-02-09 NOTE — ED Provider Notes (Signed)
 [facility] Provider Note   Event Date/Time   First MD Initiated Contact with Patient 02/09/24 [phone]     (approximate) History  Weakness  HPI Eileen Novak is a 63 y.o. female with a stated past medical history of stage III chronic kidney disease, tobacco abuse, degenerative disc disease, prediabetes, hypertension, and thyroid  disease who presents complaining of fatigue, generalized muscle aches, and dysuria that has been present for the last week.  Patient states that she is currently on antibiotics for urinary tract infection but does not know the name of them.  Patient has been having nausea and vomiting over the last couple days and was concerned that it was related to her antibiotics and therefore these antibiotics were changed yesterday. ROS: Patient currently denies any vision changes, tinnitus, difficulty speaking, facial droop, sore throat, chest pain, shortness of breath, abdominal pain, nausea/vomiting/diarrhea, dysuria, or numbness/paresthesias in any extremity   Physical Exam  Triage Vital Signs: ED Triage Vitals  Encounter Vitals Group     BP 02/09/24 0734 120/89     Girls Systolic BP Percentile --      Girls Diastolic BP Percentile --      Boys Systolic BP Percentile --      Boys Diastolic BP Percentile --      Pulse Rate 02/09/24 0734 98     Resp 02/09/24 0734 17     Temp 02/09/24 0734 98.7 F (37.1 C)     Temp Source 02/09/24 0734 Oral     SpO2 02/09/24 0734 98 %     Weight 02/09/24 0736 138 lb (62.6 kg)     Height 02/09/24 0736 5' 5 (1.651 m)     Head Circumference --      Peak Flow --      Pain Score 02/09/24 0735 0     Pain Loc --      Pain Education --      Exclude from Growth Chart --    Most recent vital signs: Vitals:   02/09/24 0734  BP: 120/89  Pulse: 98  Resp: 17  Temp: 98.7 F (37.1 C)  SpO2: 98%   General: Awake, oriented x4. CV:  Good peripheral perfusion. Resp:  Normal effort. Abd:  No  distention. Other:  Middle-aged well-developed, well-nourished Caucasian female resting comfortably in no acute distress ED Results / Procedures / Treatments  Labs (all labs ordered are listed, but only abnormal results are displayed) Labs Reviewed  COMPREHENSIVE METABOLIC PANEL WITH GFR - Abnormal; Notable for the following components:      Result Value   CO2 19 (*)    BUN 45 (*)    Creatinine, Ser 2.64 (*)    Calcium  10.5 (*)    GFR, Estimated 20 (*)    All other components within normal limits  LIPASE, BLOOD - Abnormal; Notable for the following components:   Lipase 63 (*)    All other components within normal limits  URINALYSIS, W/ REFLEX TO CULTURE (INFECTION SUSPECTED) - Abnormal; Notable for the following components:   Color, Urine YELLOW (*)    APPearance HAZY (*)    Glucose, UA >=500 (*)    Leukocytes,Ua TRACE (*)    All other components within normal limits  RESP PANEL BY RT-PCR (RSV, FLU A&B, COVID)  [id number]  CBC WITH DIFFERENTIAL/PLATELET  PROCEDURES: Critical Care performed: No Procedures MEDICATIONS ORDERED IN ED: Medications  lactated ringers  bolus 1,000 mL (1,000 mLs Intravenous New Bag/Given 02/09/24 0825)   IMPRESSION / MDM /  ASSESSMENT AND PLAN / ED COURSE  I reviewed the triage vital signs and the nursing notes.                             The patient is on the cardiac monitor to evaluate for evidence of arrhythmia and/or significant heart rate changes. Patient's presentation is most consistent with acute presentation with potential threat to life or bodily function. Patient is a 63 year old female with the above-stated past medical history who presents complaining of worsening muscular fatigue and recent treatment for urinary tract infection on antibiotics. DDx: Viral syndrome, AKI, acute renal failure, ACS/CAD Plan: CBC, CMP, lipase, UA, RVP  Clinical Course as of 02/09/24 0933  Sun Feb 09, 2024  0928 Upon reassessment, patient was informed of her  worsening creatinine.  Patient's UA does not show any persistent urinary tract infection.  I am concerned that patient's creatinine is continuing to downtrend and therefore I have recommended admission for which patient agrees.  I spoke to the on-call hospitalist who agrees to accept this patient for further evaluation and management. Dispo: Admit to medicine [EB]    Clinical Course User Index [EB] Jossie Artist POUR, MD   FINAL CLINICAL IMPRESSION(S) / ED DIAGNOSES   Final diagnoses:  Acute renal failure superimposed on chronic kidney disease, unspecified acute renal failure type, unspecified CKD stage  Generalized weakness   Rx / DC Orders   ED Discharge Orders     None      Note:  This document was prepared using Dragon voice recognition software and may include unintentional dictation errors.   Azariyah Luhrs K, MD 02/09/24 [phone]

## 2024-02-10 DIAGNOSIS — N179 Acute kidney failure, unspecified: Secondary | ICD-10-CM | POA: Diagnosis not present

## 2024-02-10 LAB — BASIC METABOLIC PANEL WITH GFR
Anion gap: 8 (ref 5–15)
BUN: 36 mg/dL — ABNORMAL HIGH (ref 8–23)
CO2: 24 mmol/L (ref 22–32)
Calcium: 9 mg/dL (ref 8.9–10.3)
Chloride: 108 mmol/L (ref 98–111)
Creatinine, Ser: 1.65 mg/dL — ABNORMAL HIGH (ref 0.44–1.00)
GFR, Estimated: 35 mL/min — ABNORMAL LOW (ref >60)
Glucose, Bld: 95 mg/dL (ref 70–99)
Potassium: 4.1 mmol/L (ref 3.5–5.1)
Sodium: 140 mmol/L (ref 135–145)

## 2024-02-10 MED ORDER — METOPROLOL SUCCINATE ER 50 MG PO TB24
50.0000 mg | ORAL_TABLET | Freq: Every day | ORAL | Status: DC
  Administered 2024-02-10: 50 mg via ORAL
  Filled 2024-02-10: qty 1

## 2024-02-10 MED ORDER — TOPIRAMATE 25 MG PO TABS: 50.0000 mg | ORAL_TABLET | Freq: Every day | ORAL | Status: DC

## 2024-02-10 MED ORDER — TRAMADOL HCL 50 MG PO TABS
50.0000 mg | ORAL_TABLET | Freq: Four times a day (QID) | ORAL | Status: AC | PRN
  Administered 2024-02-10: 50 mg via ORAL
  Filled 2024-02-10: qty 1

## 2024-02-10 MED ORDER — ASPIRIN 81 MG PO TBEC
81.0000 mg | DELAYED_RELEASE_TABLET | Freq: Every day | ORAL | Status: DC
Start: 2024-02-10 — End: 2024-02-10
  Administered 2024-02-10: 81 mg via ORAL
  Filled 2024-02-10: qty 1

## 2024-02-10 MED ORDER — BUSPIRONE HCL 10 MG PO TABS
10.0000 mg | ORAL_TABLET | Freq: Two times a day (BID) | ORAL | Status: DC
Start: 2024-02-10 — End: 2024-02-10
  Administered 2024-02-10: 10 mg via ORAL
  Filled 2024-02-10: qty 1

## 2024-02-10 NOTE — Discharge Summary (Signed)
 Physician Discharge Summary   Patient: Eileen Novak MRN: [id number] DOB: 11-16-1960  Admit date:     02/09/2024  Discharge date: 02/10/24  Discharge Physician: Laree Lock   PCP: Auston Reyes BIRCH, MD   Recommendations at discharge:   Follow up with PCP - Repeat CMP in 1 week Monitor BP and resume meds as tolerated  Follow up with Nephrology outpatient  Discharge Diagnoses: Principal Problem:   AKI (acute kidney injury)  Hospital Course: Solomia Montague-Skipper is a 63 y.o. female with medical history significant of CKD stage IIIb, HTN, chronic HFrEF, hypothyroidism, presented with generalized weakness, malaise and AKI. Recently being treated for UTI.  Hospital course as below  AKI on CKD stage IIIb, resolved - Cr 2.64 on presentation, s/p gentle IV fluids improved to 1.65, baseline Cr 1.5. Glucosuria likely due to Jardiance - US  renal negative for obstruction - Seen by Nephrology, follow up outpatient - Resume ARB as BP tolerates - Hold Jardiance - Avoid NSAID's, BC powder   Hypercalcemia - Hold calcium  supplements (TUMS) - Kappa Lambda ratio and SPEP pending   HTN - Hold Valsartan, Amlodipine - resume as tolerated   Chronic HFrEF - Euvolemic, s/p gentle hydration for AKI - Not on any diuresis at home, Hold Jardiance - Continue metoprolol, aspirin.   UTI - complete Macrobid  as instructed    Consultants: Nephrology Procedures performed: None  Disposition: Home Diet recommendation:  Discharge Diet Orders (From admission, onward)     Start     Ordered   02/10/24 0000  Diet - low sodium heart healthy        02/10/24 1048           DISCHARGE MEDICATION: Allergies as of 02/10/2024       Reactions   Escitalopram Oxalate    REACTION: nausea, decreased libito   Penicillins    REACTION: as child        Medication List     PAUSE taking these medications    amLODipine 2.5 MG tablet Wait to take this until your doctor or other care  provider tells you to start again. Commonly known as: NORVASC Take 2.5 mg by mouth daily.   empagliflozin 10 MG Tabs tablet Wait to take this until your doctor or other care provider tells you to start again. Commonly known as: JARDIANCE Take 10 mg by mouth daily.   valsartan 40 MG tablet Wait to take this until your doctor or other care provider tells you to start again. Commonly known as: DIOVAN Take 40 mg by mouth daily.       STOP taking these medications    calcium  carbonate 500 MG chewable tablet Commonly known as: TUMS - dosed in mg elemental calcium        TAKE these medications    ALPRAZolam  0.25 MG tablet Commonly known as: XANAX  Take 0.25 mg by mouth at bedtime as needed for anxiety.   aspirin EC 81 MG tablet Take 81 mg by mouth daily.   baclofen 10 MG tablet Commonly known as: LIORESAL Take 10 mg by mouth 3 (three) times daily.   buPROPion 300 MG 24 hr tablet Commonly known as: WELLBUTRIN XL Take 300 mg by mouth daily.   busPIRone 10 MG tablet Commonly known as: BUSPAR Take 10 mg by mouth 2 (two) times daily.   cyclobenzaprine  10 MG tablet Commonly known as: FLEXERIL  Take 10 mg by mouth at bedtime.   famotidine  40 MG tablet Commonly known as: PEPCID  Take 40 mg by mouth at  bedtime.   lisdexamfetamine 60 MG capsule Commonly known as: VYVANSE Take 60 mg by mouth every morning.   metoprolol succinate 50 MG 24 hr tablet Commonly known as: TOPROL-XL Take 50 mg by mouth daily. Take with or immediately following a meal.   nitrofurantoin  (macrocrystal-monohydrate) 100 MG capsule Commonly known as: MACROBID  Take 100 mg by mouth 2 (two) times daily.   pantoprazole  40 MG tablet Commonly known as: PROTONIX  Take 40 mg by mouth 2 (two) times daily.   promethazine 25 MG tablet Commonly known as: PHENERGAN Take 25 mg by mouth every 8 (eight) hours as needed.   thyroid  60 MG tablet Commonly known as: ARMOUR Take 90 mg by mouth daily before  breakfast.   topiramate 50 MG tablet Commonly known as: TOPAMAX Take 50 mg by mouth at bedtime.        Discharge Exam: Filed Weights   02/09/24 0736  Weight: 62.6 kg    Constitutional: NAD, calm, comfortable  Neck: normal, supple, no masses, no thyromegaly Respiratory: clear to auscultation bilaterally, no wheezing, no crackles Cardiovascular: Regular rate and rhythm, no murmurs / rubs / gallops. No extremity edema Abdomen: no tenderness, no masses palpated  Skin: no rashes, lesions, ulcers. No induration Neurologic: AO x3, no gross focal deficits  Condition at discharge: good  The results of significant diagnostics from this hospitalization (including imaging, microbiology, ancillary and laboratory) are listed below for reference.   Imaging Studies: US  RENAL Result Date: 02/09/2024 CLINICAL DATA:  [id number] AKI (acute kidney injury) [id number] EXAM: RENAL / URINARY TRACT ULTRASOUND COMPLETE COMPARISON:  None Available. FINDINGS: Right Kidney: Renal measurements: 9.6 x 3.7 x 3.6 cm = volume: 67 mL.Normal echogenicity. No mass. No hydronephrosis or nephrolithiasis. Multiple nonshadowing echogenicities noted by the sonographer, likely echogenic renal hilar fat. Left Kidney: Renal measurements: 9.1 x 3.8 x 3.8 cm = volume: 68 mL. Normal echogenicity. No mass. No hydronephrosis or nephrolithiasis. Multiple nonshadowing echogenicities noted by the sonographer, likely echogenic renal hilar fat. Bladder: Appears normal for degree of bladder distention. Both ureteral jets present. Other: None. IMPRESSION: No hydronephrosis or nephrolithiasis. Electronically Signed   By: Rogelia Myers M.D.   On: 02/09/2024 15:57   VAS US  DOP BILAT COMP TOS DIGITS REYNAUD Result Date: 02/03/2024 UPPER EXTREMITY DOPPLER STUDY Patient Name:  Eileen Novak  Date of Exam:   01/31/2024 Medical Rec #: [id number]               Accession #:    [id number] Date of Birth: 08/26/1960               Patient Gender: F  Patient Age:   4 years Exam Location:  [facility] Procedure:      VAS UE DOPPLER BILAT/COMP TOS, DIGITS (TO&UE REYNAUDS) Referring Phys: --------------------------------------------------------------------------------  Indications: Right middle finger discolored one day, no pain. no repeat              incident.  Performing Technologist: Jerel Croak RVT  Examination Guidelines: A complete evaluation includes B-mode imaging, spectral Doppler, color Doppler, and power Doppler as needed of all accessible portions of each vessel. Bilateral testing is considered an integral part of a complete examination. Limited examinations for reoccurring indications may be performed as noted.  Right Doppler Findings: +--------+--------+-----+---------+--------+ Site    PressureIndexDoppler  Comments +--------+--------+-----+---------+--------+ [id number]          triphasic         +--------+--------+-----+---------+--------+ Radial  138     0.95 triphasic         +--------+--------+-----+---------+--------+  Ulnar   150     1.03 triphasic         +--------+--------+-----+---------+--------+ Digit   135     0.92                   +--------+--------+-----+---------+--------+  +---+----+  WBI1.03  +---+----+ Left Doppler Findings: +--------+--------+-----+---------+--------+ Site    PressureIndexDoppler  Comments +--------+--------+-----+---------+--------+ [id number]          triphasic         +--------+--------+-----+---------+--------+ Radial  149     1.02 triphasic         +--------+--------+-----+---------+--------+ Ulnar   148     1.01 triphasic         +--------+--------+-----+---------+--------+ Digit   134     0.92                   +--------+--------+-----+---------+--------+  +---+----+  WBI1.01  +---+----+  Technologist Notes: Right: Digital PPG tracings obtained appear appropriately pulsatile. Left: Digital PPG tracings obtained appear  appropriately pulsatile.  Summary:  Right: No significant arterial obstruction detected in the right        upper extremity. Left: No significant arterial obstruction detected in the left upper       extremity. *See table(s) above for measurements and observations. Electronically signed by Selinda Gu MD on 02/03/2024 at 7:30:14 AM.    Final     Microbiology: Results for orders placed or performed during the hospital encounter of 02/09/24  Resp panel by RT-PCR (RSV, Flu A&B, Covid) Anterior Nasal Swab     Status: None   Collection Time: 02/09/24  7:51 AM   Specimen: Anterior Nasal Swab  Result Value Ref Range Status   SARS Coronavirus 2 by RT PCR NEGATIVE NEGATIVE Final    Comment: (NOTE) SARS-CoV-2 target nucleic acids are NOT DETECTED.  The SARS-CoV-2 RNA is generally detectable in upper respiratory specimens during the acute phase of infection. The lowest concentration of SARS-CoV-2 viral copies this assay can detect is 138 copies/mL. A negative result does not preclude SARS-Cov-2 infection and should not be used as the sole basis for treatment or other patient management decisions. A negative result may occur with  improper specimen collection/handling, submission of specimen other than nasopharyngeal swab, presence of viral mutation(s) within the areas targeted by this assay, and inadequate number of viral copies(<138 copies/mL). A negative result must be combined with clinical observations, patient history, and epidemiological information. The expected result is Negative.  Fact Sheet for Patients:  BloggerCourse.com  Fact Sheet for Healthcare Providers:  SeriousBroker.it  This test is no t yet approved or cleared by the [place]  FDA and  has been authorized for detection and/or diagnosis of SARS-CoV-2 by FDA under an Emergency Use Authorization (EUA). This EUA will remain  in effect (meaning this test can be used) for the  duration of the COVID-19 declaration under Section 564(b)(1) of the Act, 21 U.S.C.section 360bbb-3(b)(1), unless the authorization is terminated  or revoked sooner.       Influenza A by PCR NEGATIVE NEGATIVE Final   Influenza B by PCR NEGATIVE NEGATIVE Final    Comment: (NOTE) The Xpert Xpress SARS-CoV-2/FLU/RSV plus assay is intended as an aid in the diagnosis of influenza from Nasopharyngeal swab specimens and should not be used as a sole basis for treatment. Nasal washings and aspirates are unacceptable for Xpert Xpress SARS-CoV-2/FLU/RSV testing.  Fact Sheet for Patients: BloggerCourse.com  Fact Sheet for Healthcare Providers: SeriousBroker.it  This test is not yet approved  or cleared by the [place]  FDA and has been authorized for detection and/or diagnosis of SARS-CoV-2 by FDA under an Emergency Use Authorization (EUA). This EUA will remain in effect (meaning this test can be used) for the duration of the COVID-19 declaration under Section 564(b)(1) of the Act, 21 U.S.C. section 360bbb-3(b)(1), unless the authorization is terminated or revoked.     Resp Syncytial Virus by PCR NEGATIVE NEGATIVE Final    Comment: (NOTE) Fact Sheet for Patients: BloggerCourse.com  Fact Sheet for Healthcare Providers: SeriousBroker.it  This test is not yet approved or cleared by the [place]  FDA and has been authorized for detection and/or diagnosis of SARS-CoV-2 by FDA under an Emergency Use Authorization (EUA). This EUA will remain in effect (meaning this test can be used) for the duration of the COVID-19 declaration under Section 564(b)(1) of the Act, 21 U.S.C. section 360bbb-3(b)(1), unless the authorization is terminated or revoked.  Performed at [facility], [address] [place], [place] [address]     Labs: CBC: Recent Labs  Lab 02/09/24 0751  WBC  6.0  NEUTROABS 2.8  HGB 13.3  HCT 39.6  MCV 95.2  PLT 363   Basic Metabolic Panel: Recent Labs  Lab 02/09/24 0751 02/10/24 0831  NA 138 140  K 4.3 4.1  CL 104 108  CO2 19* 24  GLUCOSE 88 95  BUN 45* 36*  CREATININE 2.64* 1.65*  CALCIUM  10.5* 9.0   Liver Function Tests: Recent Labs  Lab 02/09/24 0751  AST 27  ALT 17  ALKPHOS 77  BILITOT 0.5  PROT 7.4  ALBUMIN 4.1   CBG: No results for input(s): GLUCAP in the last 168 hours.  Discharge time spent: greater than 30 minutes.  Signed: Laree Lock, MD [facility] 02/10/2024

## 2024-02-10 NOTE — Plan of Care (Signed)

## 2024-02-10 NOTE — Progress Notes (Signed)
 [facility]  ROUNDING NOTE   Subjective:   Patient sitting up in bed Husband at the bedside Alert Appetite appropriate  Creatinine 1.65  Objective:  Vital signs in last 24 hours:  Temp:  [97.5 F (36.4 C)-98.1 F (36.7 C)] 98.1 F (36.7 C) (10/06 0817) Pulse Rate:  [73-93] 85 (10/06 0817) Resp:  [16-17] 16 (10/06 0817) BP: (104-120)/(64-79) 120/76 (10/06 0817) SpO2:  [97 %-100 %] 97 % (10/06 0817)  Weight change:  Filed Weights   02/09/24 0736  Weight: 62.6 kg    Intake/Output: I/O last 3 completed shifts: In: 600 [P.O.:600] Out: -    Intake/Output this shift:  No intake/output data recorded.  Physical Exam: General: NAD  Head: Normocephalic, atraumatic. Moist oral mucosal membranes  Eyes: Anicteric  Lungs:  Clear to auscultation,normal effort  Heart: Regular rate and rhythm  Abdomen:  Soft, nontender  Extremities:  No peripheral edema.  Neurologic: Awake, alert, conversant  Skin: Warm,dry, no rash       Basic Metabolic Panel: Recent Labs  Lab 02/09/24 0751 02/10/24 0831  NA 138 140  K 4.3 4.1  CL 104 108  CO2 19* 24  GLUCOSE 88 95  BUN 45* 36*  CREATININE 2.64* 1.65*  CALCIUM  10.5* 9.0    Liver Function Tests: Recent Labs  Lab 02/09/24 0751  AST 27  ALT 17  ALKPHOS 77  BILITOT 0.5  PROT 7.4  ALBUMIN 4.1   Recent Labs  Lab 02/09/24 0751  LIPASE 63*   No results for input(s): AMMONIA in the last 168 hours.  CBC: Recent Labs  Lab 02/09/24 0751  WBC 6.0  NEUTROABS 2.8  HGB 13.3  HCT 39.6  MCV 95.2  PLT 363    Cardiac Enzymes: No results for input(s): CKTOTAL, CKMB, CKMBINDEX, TROPONINI in the last 168 hours.  BNP: Invalid input(s): POCBNP  CBG: No results for input(s): GLUCAP in the last 168 hours.  Microbiology: Results for orders placed or performed during the hospital encounter of 02/09/24  Resp panel by RT-PCR (RSV, Flu A&B, Covid) Anterior Nasal Swab     Status: None   Collection  Time: 02/09/24  7:51 AM   Specimen: Anterior Nasal Swab  Result Value Ref Range Status   SARS Coronavirus 2 by RT PCR NEGATIVE NEGATIVE Final    Comment: (NOTE) SARS-CoV-2 target nucleic acids are NOT DETECTED.  The SARS-CoV-2 RNA is generally detectable in upper respiratory specimens during the acute phase of infection. The lowest concentration of SARS-CoV-2 viral copies this assay can detect is 138 copies/mL. A negative result does not preclude SARS-Cov-2 infection and should not be used as the sole basis for treatment or other patient management decisions. A negative result may occur with  improper specimen collection/handling, submission of specimen other than nasopharyngeal swab, presence of viral mutation(s) within the areas targeted by this assay, and inadequate number of viral copies(<138 copies/mL). A negative result must be combined with clinical observations, patient history, and epidemiological information. The expected result is Negative.  Fact Sheet for Patients:  BloggerCourse.com  Fact Sheet for Healthcare Providers:  SeriousBroker.it  This test is no t yet approved or cleared by the [place]  FDA and  has been authorized for detection and/or diagnosis of SARS-CoV-2 by FDA under an Emergency Use Authorization (EUA). This EUA will remain  in effect (meaning this test can be used) for the duration of the COVID-19 declaration under Section 564(b)(1) of the Act, 21 U.S.C.section 360bbb-3(b)(1), unless the authorization is terminated  or  revoked sooner.       Influenza A by PCR NEGATIVE NEGATIVE Final   Influenza B by PCR NEGATIVE NEGATIVE Final    Comment: (NOTE) The Xpert Xpress SARS-CoV-2/FLU/RSV plus assay is intended as an aid in the diagnosis of influenza from Nasopharyngeal swab specimens and should not be used as a sole basis for treatment. Nasal washings and aspirates are unacceptable for Xpert Xpress  SARS-CoV-2/FLU/RSV testing.  Fact Sheet for Patients: BloggerCourse.com  Fact Sheet for Healthcare Providers: SeriousBroker.it  This test is not yet approved or cleared by the [place]  FDA and has been authorized for detection and/or diagnosis of SARS-CoV-2 by FDA under an Emergency Use Authorization (EUA). This EUA will remain in effect (meaning this test can be used) for the duration of the COVID-19 declaration under Section 564(b)(1) of the Act, 21 U.S.C. section 360bbb-3(b)(1), unless the authorization is terminated or revoked.     Resp Syncytial Virus by PCR NEGATIVE NEGATIVE Final    Comment: (NOTE) Fact Sheet for Patients: BloggerCourse.com  Fact Sheet for Healthcare Providers: SeriousBroker.it  This test is not yet approved or cleared by the [place]  FDA and has been authorized for detection and/or diagnosis of SARS-CoV-2 by FDA under an Emergency Use Authorization (EUA). This EUA will remain in effect (meaning this test can be used) for the duration of the COVID-19 declaration under Section 564(b)(1) of the Act, 21 U.S.C. section 360bbb-3(b)(1), unless the authorization is terminated or revoked.  Performed at [facility], [address] [place], [place] [address]     Coagulation Studies: No results for input(s): LABPROT, INR in the last 72 hours.  Urinalysis: Recent Labs    02/09/24 0829  COLORURINE YELLOW*  LABSPEC 1.014  PHURINE 5.0  GLUCOSEU >=500*  HGBUR NEGATIVE  BILIRUBINUR NEGATIVE  KETONESUR NEGATIVE  PROTEINUR NEGATIVE  NITRITE NEGATIVE  LEUKOCYTESUR TRACE*      Imaging: US  RENAL Result Date: 02/09/2024 CLINICAL DATA:  [id number] AKI (acute kidney injury) [id number] EXAM: RENAL / URINARY TRACT ULTRASOUND COMPLETE COMPARISON:  None Available. FINDINGS: Right Kidney: Renal measurements: 9.6 x 3.7 x 3.6 cm = volume: 67  mL.Normal echogenicity. No mass. No hydronephrosis or nephrolithiasis. Multiple nonshadowing echogenicities noted by the sonographer, likely echogenic renal hilar fat. Left Kidney: Renal measurements: 9.1 x 3.8 x 3.8 cm = volume: 68 mL. Normal echogenicity. No mass. No hydronephrosis or nephrolithiasis. Multiple nonshadowing echogenicities noted by the sonographer, likely echogenic renal hilar fat. Bladder: Appears normal for degree of bladder distention. Both ureteral jets present. Other: None. IMPRESSION: No hydronephrosis or nephrolithiasis. Electronically Signed   By: Rogelia Myers M.D.   On: 02/09/2024 15:57     Medications:    cefTRIAXone  (ROCEPHIN )  IV Stopped (02/09/24 1141)    aspirin EC  81 mg Oral Daily   brexpiprazole  0.5 mg Oral Daily   buPROPion  300 mg Oral Daily   busPIRone  10 mg Oral BID   cyclobenzaprine   10 mg Oral QHS   famotidine   40 mg Oral QHS   heparin  5,000 Units Subcutaneous Q8H   lisdexamfetamine  60 mg Oral BH-[id number]   metoprolol succinate  50 mg Oral Daily   pantoprazole   40 mg Oral Daily   thyroid   60 mg Oral QAC breakfast   topiramate  50 mg Oral QHS   acetaminophen  **OR** acetaminophen , ALPRAZolam , hydrALAZINE, ondansetron  **OR** ondansetron  (ZOFRAN ) IV  Assessment/ Plan:  Ms. Brandyn Thien is a 63 y.o.  female  with medical problems of hypertension, chronic heart  failure with reduced ejection fraction, hypothyroidism, left bundle branch block presents for new onset of nausea and vomiting 1 to 2 days prior to presentation.   AKI on CKD st 3b  Patient has underlying CKD likely secondary to hypertension and chronic use of nonsteroidals in the form of BCs.  Her baseline creatinine is 1.5/GFR 39 from January 24, 2024. Renal ultrasound negative for obstruction.  Urinalysis is reassuring.  Negative for blood or protein. Glucosuria may be due to use of Jardiance. Agree with holding Jardiance and valsartan. Creatinine has improved with supportive  measures. Will schedule follow up appt with Dr Jimmie.   Lab Results  Component Value Date   CREATININE 1.65 (H) 02/10/2024   CREATININE 2.64 (H) 02/09/2024   CREATININE 1.62 (H) 01/20/2024    Intake/Output Summary (Last 24 hours) at 02/10/2024 1419 Last data filed at 02/10/2024 0500 Gross per 24 hour  Intake 600 ml  Output --  Net 600 ml   Hypertension Currently managed with as needed hydralazine. Blood pressure and volume status are controlled at this time. Once serum creatinine starts to improve, and if blood pressure is greater than 140 systolic, valsartan held   Hypercalcemia Patient was taking calcium  supplements. Agree with holding supplements for now. Kappa lambda ratio and SPEP pending    LOS: 0 Eileen Novak 10/6/20252:19 PM

## 2024-02-11 LAB — PROTEIN ELECTROPHORESIS, SERUM
A/G Ratio: 1.4 (ref 0.7–1.7)
Albumin ELP: 3.3 g/dL (ref 2.9–4.4)
Alpha-1-Globulin: 0.2 g/dL (ref 0.0–0.4)
Alpha-2-Globulin: 0.9 g/dL (ref 0.4–1.0)
Beta Globulin: 0.7 g/dL (ref 0.7–1.3)
Gamma Globulin: 0.5 g/dL (ref 0.4–1.8)
Globulin, Total: 2.3 g/dL (ref 2.2–3.9)
Total Protein ELP: 5.6 g/dL — ABNORMAL LOW (ref 6.0–8.5)

## 2024-02-11 LAB — KAPPA/LAMBDA LIGHT CHAINS
Kappa free light chain: 12.8 mg/L (ref 3.3–19.4)
Kappa, lambda light chain ratio: 0.95 (ref 0.26–1.65)
Lambda free light chains: 13.5 mg/L (ref 5.7–26.3)

## 2024-03-18 ENCOUNTER — Encounter: Payer: Self-pay | Admitting: Dietician

## 2024-03-18 ENCOUNTER — Encounter: Attending: Internal Medicine | Admitting: Dietician

## 2024-03-18 DIAGNOSIS — N1832 Chronic kidney disease, stage 3b: Secondary | ICD-10-CM | POA: Diagnosis not present

## 2024-03-18 DIAGNOSIS — I10 Essential (primary) hypertension: Secondary | ICD-10-CM

## 2024-03-18 NOTE — Patient Instructions (Addendum)
 Do seated exercises for 10-15 minutes for a few days a week.  I encourage you to find an outlet for your stress, including therapy (virtual or in person) or exercise! This will help your overall health tremendously  Be sure to get in 64 oz of water EACH day. Have a sugar-free popsicle of Jello as an evening snack for additional fluids.  Choose lower sodium foods, especially packaged/canned, as often as possible!!  Choose lower potassium and phosphorus foods from the food lists provided as often as possible.

## 2024-03-18 NOTE — Progress Notes (Signed)
 Medical Nutrition Therapy  Appointment Start time:  [phone]  Appointment End time:  0930  Primary concerns today: Renal function  Referral diagnosis: I10 (ICD-10-CM) - Essential (primary) hypertension, R73.03 (ICD-10-CM) - Prediabetes, N18.32 (ICD-10-CM) - Chronic kidney disease, stage 3b, E78.00 (ICD-10-CM) - Pure hypercholesterolemia, unspecified Preferred learning style: Auditory Learning readiness: Not ready   NUTRITION ASSESSMENT    Clinical Medical Hx: CHF, HTN, Prediabetes, HLD, CKD S3, GERD, Hypothyroidism Medications: Valsartan, Topiramate, Pantoprazole , Metoprolol, Famotidine  Labs (03/16/2024): BUN - 40, Creatinine - 1.8, eGFR - 31  Cholesterol - 273, LDL - 174, RBC - 3.94 Notable Signs/Symptoms: N/A   Lifestyle & Dietary Hx Pt spouse, Norleen, present for appointment Pt reports concern of renal function declining, states they want to prevent progression of CKD. Pt reports researching a lot of information on the internet to help improve renal function. Pt reports numerous health concerns with their son and mother that causes high levels of stress, pt states they are constantly stressed and may only sleep 2-4 hours a day. Pt report skipping breakfast, not hungry and too busy, first meal is usually mid-day after running errands/helping son. Pt states they usually get back home between 12-3 pm, has a sandwich on keto bread with deli meats and mayonnaise. Pt reports trying to drink a lot of water/almond milk in the evening, doesn't drink much fluid during the day.    Estimated daily fluid intake: <48 oz Supplements: None Sleep: Poor, 2-4 hrs Stress / self-care: Very High, dealing with family health problems Current average weekly physical activity: ADLs, very active with chores, no exercise  24-Hr Dietary Recall First Meal: Turkey sandwich with lettuce tomato and mayo on keto bun Snack: Candy corn Second Meal: 4 Link sausages, homemade egg salad Snack: Sugar-free  Third Meal:   Snack:  Beverages: Almond milk, water   NUTRITION DIAGNOSIS  NB-1.4 Self-monitoring deficit As related to stress.  As evidenced by self-reported extreme stress levels, no current therapy/stress reduction plan, poor sleep quality, PMH of HTN, CKD S3, Prediabetes.   NUTRITION INTERVENTION  Nutrition education (E-1) on the following topics:  Educated patient on the nutrients of concern for CKD, including sodium, phosphorus, and potassium. Educated patient on adequate protein intake of 0.8g/kg CBW. Educated patient on importance of proper hydration daily for renal function. Educated patient on the physiological impact of stress on blood pressure, blood sugar, and sleep quality.   Handouts Provided Include  CKD S3-5 Nutrition Therapy Phosphorus Content of Foods Potassium Content of Foods  Learning Style & Readiness for Change Teaching method utilized: Visual & Auditory  Demonstrated degree of understanding via: Teach Back  Barriers to learning/adherence to lifestyle change: Stress/Family obligations  Goals Established by Pt Do seated exercises for 10-15 minutes for a few days a week. I encourage you to find an outlet for your stress, including therapy (virtual or in person) or exercise! This will help your overall health tremendously Be sure to get in 64 oz of water EACH day. Have a sugar-free popsicle of Jello as an evening snack for additional fluids. Choose lower sodium foods, especially packaged/canned, as often as possible!! Choose lower potassium and phosphorus foods from the food lists provided as often as possible.   MONITORING & EVALUATION Dietary intake, weekly physical activity, and labs in 6-8 weeks.  Next Steps  Patient is to follow up with RD.

## 2024-04-23 ENCOUNTER — Inpatient Hospital Stay: Admission: RE | Admit: 2024-04-23 | Discharge: 2024-04-23 | Attending: Internal Medicine | Admitting: Internal Medicine

## 2024-04-23 DIAGNOSIS — Z1231 Encounter for screening mammogram for malignant neoplasm of breast: Secondary | ICD-10-CM | POA: Insufficient documentation

## 2024-05-12 ENCOUNTER — Encounter: Admitting: Dietician
